# Patient Record
Sex: Male | Born: 1995 | Race: White | Hispanic: No | Marital: Single | Smoking: Former smoker
Health system: Southern US, Community
[De-identification: ages and names within clinical notes are randomized; demographics above are authoritative.]

## PROBLEM LIST (undated history)

## (undated) DIAGNOSIS — F909 Attention-deficit hyperactivity disorder, unspecified type: Secondary | ICD-10-CM

## (undated) DIAGNOSIS — J302 Other seasonal allergic rhinitis: Secondary | ICD-10-CM

## (undated) DIAGNOSIS — F32A Depression, unspecified: Secondary | ICD-10-CM

## (undated) DIAGNOSIS — D619 Aplastic anemia, unspecified: Secondary | ICD-10-CM

## (undated) DIAGNOSIS — F329 Major depressive disorder, single episode, unspecified: Secondary | ICD-10-CM

## (undated) DIAGNOSIS — N522 Drug-induced erectile dysfunction: Secondary | ICD-10-CM

## (undated) DIAGNOSIS — R519 Headache, unspecified: Secondary | ICD-10-CM

## (undated) DIAGNOSIS — I471 Supraventricular tachycardia: Secondary | ICD-10-CM

## (undated) HISTORY — DX: Aplastic anemia, unspecified: D61.9

## (undated) HISTORY — DX: Depression, unspecified: F32.A

## (undated) HISTORY — DX: Attention-deficit hyperactivity disorder, unspecified type: F90.9

## (undated) HISTORY — DX: Drug-induced erectile dysfunction: N52.2

## (undated) HISTORY — DX: Other seasonal allergic rhinitis: J30.2

## (undated) HISTORY — DX: Supraventricular tachycardia: I47.1

## (undated) HISTORY — DX: Headache, unspecified: R51.9

---
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
## 2015-01-19 ENCOUNTER — Telehealth: Payer: Self-pay

## 2015-01-19 ENCOUNTER — Ambulatory Visit (INDEPENDENT_AMBULATORY_CARE_PROVIDER_SITE_OTHER): Payer: BLUE CROSS/BLUE SHIELD | Admitting: Family Medicine

## 2015-01-19 VITALS — BP 124/76 | HR 84 | Temp 97.5°F | Resp 17 | Ht 70.5 in | Wt 113.0 lb

## 2015-01-19 DIAGNOSIS — Z720 Tobacco use: Secondary | ICD-10-CM

## 2015-01-19 DIAGNOSIS — J209 Acute bronchitis, unspecified: Secondary | ICD-10-CM

## 2015-01-19 MED ORDER — AZITHROMYCIN 250 MG PO TABS: ORAL_TABLET | ORAL | Status: DC

## 2015-01-19 MED ORDER — BENZONATATE 200 MG PO CAPS: 200.0000 mg | ORAL_CAPSULE | Freq: Three times a day (TID) | ORAL | Status: DC | PRN

## 2015-01-19 NOTE — Patient Instructions (Signed)
Nicotine Addiction Nicotine can act as both a stimulant (excites/activates) and a sedative (calms/quiets). Immediately after exposure to nicotine, there is a "kick" caused in part by the drug's stimulation of the adrenal glands and resulting discharge of adrenaline (epinephrine). The rush of adrenaline stimulates the body and causes a sudden release of sugar. This means that smokers are always slightly hyperglycemic. Hyperglycemic means that the blood sugar is high, just like in diabetics. Nicotine also decreases the amount of insulin which helps control sugar levels in the body. There is an increase in blood pressure, breathing, and the rate of heart beats.  In addition, nicotine indirectly causes a release of dopamine in the brain that controls pleasure and motivation. A similar reaction is seen with other drugs of abuse, such as cocaine and heroin. This dopamine release is thought to cause the pleasurable sensations when smoking. In some different cases, nicotine can also create a calming effect, depending on sensitivity of the smoker's nervous system and the dose of nicotine taken. WHAT HAPPENS WHEN NICOTINE IS TAKEN FOR LONG PERIODS OF TIME?  Long-term use of nicotine results in addiction. It is difficult to stop.  Repeated use of nicotine creates tolerance. Higher doses of nicotine are needed to get the "kick." When nicotine use is stopped, withdrawal may last a month or more. Withdrawal may begin within a few hours after the last cigarette. Symptoms peak within the first few days and may lessen within a few weeks. For some people, however, symptoms may last for months or longer. Withdrawal symptoms include:   Irritability.  Craving.  Learning and attention deficits.  Sleep disturbances.  Increased appetite. Craving for tobacco may last for 6 months or longer. Many behaviors done while using nicotine can also play a part in the severity of withdrawal symptoms. For some people, the feel,  smell, and sight of a cigarette and the ritual of obtaining, handling, lighting, and smoking the cigarette are closely linked with the pleasure of smoking. When stopped, they also miss the related behaviors which make the withdrawal or craving worse. While nicotine gum and patches may lessen the drug aspects of withdrawal, cravings often persist. WHAT ARE THE MEDICAL CONSEQUENCES OF NICOTINE USE?  Nicotine addiction accounts for one-third of all cancers. The top cancer caused by tobacco is lung cancer. Lung cancer is the number one cancer killer of both men and women.  Smoking is also associated with cancers of the:  Mouth.  Pharynx.  Larynx.  Esophagus.  Stomach.  Pancreas.  Cervix.  Kidney.  Ureter.  Bladder.  Smoking also causes lung diseases such as lasting (chronic) bronchitis and emphysema.  It worsens asthma in adults and children.  Smoking increases the risk of heart disease, including:  Stroke.  Heart attack.  Vascular disease.  Aneurysm.  Passive or secondary smoke can also increase medical risks including:  Asthma in children.  Sudden Infant Death Syndrome (SIDS).  Additionally, dropped cigarettes are the leading cause of residential fire fatalities.  Nicotine poisoning has been reported from accidental ingestion of tobacco products by children and pets. Death usually results in a few minutes from respiratory failure (when a person stops breathing) caused by paralysis. TREATMENT   Medication. Nicotine replacement medicines such as nicotine gum and the patch are used to stop smoking. These medicines gradually lower the dosage of nicotine in the body. These medicines do not contain the carbon monoxide and other toxins found in tobacco smoke.  Hypnotherapy.  Relaxation therapy.  Nicotine Anonymous (a 12-step support   program). Find times and locations in your local yellow pages. Document Released: 07/10/2004 Document Revised: 01/27/2012 Document  Reviewed: 12/31/2013 ExitCare Patient Information 2015 [employer]. This information is not intended to replace advice given to you by your health care provider. Make sure you discuss any questions you have with your health care provider. Acute Bronchitis Bronchitis is inflammation of the airways that extend from the windpipe into the lungs (bronchi). The inflammation often causes mucus to develop. This leads to a cough, which is the most common symptom of bronchitis.  In acute bronchitis, the condition usually develops suddenly and goes away over time, usually in a couple weeks. Smoking, allergies, and asthma can make bronchitis worse. Repeated episodes of bronchitis may cause further lung problems.  CAUSES Acute bronchitis is most often caused by the same virus that causes a cold. The virus can spread from person to person (contagious) through coughing, sneezing, and touching contaminated objects. SIGNS AND SYMPTOMS   Cough.   Fever.   Coughing up mucus.   Body aches.   Chest congestion.   Chills.   Shortness of breath.   Sore throat.  DIAGNOSIS  Acute bronchitis is usually diagnosed through a physical exam. Your health care provider will also ask you questions about your medical history. Tests, such as chest X-rays, are sometimes done to rule out other conditions.  TREATMENT  Acute bronchitis usually goes away in a couple weeks. Oftentimes, no medical treatment is necessary. Medicines are sometimes given for relief of fever or cough. Antibiotic medicines are usually not needed but may be prescribed in certain situations. In some cases, an inhaler may be recommended to help reduce shortness of breath and control the cough. A cool mist vaporizer may also be used to help thin bronchial secretions and make it easier to clear the chest.  HOME CARE INSTRUCTIONS  Get plenty of rest.   Drink enough fluids to keep your urine clear or pale yellow (unless you have a medical  condition that requires fluid restriction). Increasing fluids may help thin your respiratory secretions (sputum) and reduce chest congestion, and it will prevent dehydration.   Take medicines only as directed by your health care provider.  If you were prescribed an antibiotic medicine, finish it all even if you start to feel better.  Avoid smoking and secondhand smoke. Exposure to cigarette smoke or irritating chemicals will make bronchitis worse. If you are a smoker, consider using nicotine gum or skin patches to help control withdrawal symptoms. Quitting smoking will help your lungs heal faster.   Reduce the chances of another bout of acute bronchitis by washing your hands frequently, avoiding people with cold symptoms, and trying not to touch your hands to your mouth, nose, or eyes.   Keep all follow-up visits as directed by your health care provider.  SEEK MEDICAL CARE IF: Your symptoms do not improve after 1 week of treatment.  SEEK IMMEDIATE MEDICAL CARE IF:  You develop an increased fever or chills.   You have chest pain.   You have severe shortness of breath.  You have bloody sputum.   You develop dehydration.  You faint or repeatedly feel like you are going to pass out.  You develop repeated vomiting.  You develop a severe headache. MAKE SURE YOU:   Understand these instructions.  Will watch your condition.  Will get help right away if you are not doing well or get worse. Document Released: 12/12/2004 Document Revised: 03/21/2014 Document Reviewed: 04/27/2013 ExitCare Patient Information 2015   Information 2015 [employer], LLC. This information is not intended to replace advice given to you by your health care provider. Make sure you discuss any questions you have with your health care provider.  

## 2015-01-19 NOTE — Progress Notes (Addendum)
Subjective:    Patient ID: Bruce Curiadam F Selden, male    DOB: [birth year], 19 y.o.   MRN: [id number] This chart was scribed for Norberto SorensonEva Shaw, MD by Littie Deedsichard Sun, Medical Scribe. This patient was seen in Room 5 and the patient's care was started at 2:29 PM.   Chief Complaint  Patient presents with  . Nasal Congestion  . Cough  . Other    sneezing    HPI HPI Comments: Bruce Warner is a 19 y.o. male brought in by mother with a hx of aplastic anemia who presents to the [facility] complaining of gradual onset URI symptoms that started this morning. Patient has had an intermittent productive cough of mucus for the past few weeks, but he woke up this morning with HA, feeling warm, congestion and generalized myalgias. Mother notes that his cough worsened last night and this morning.  Patient has not tried any treatments for his symptoms. He notes that he has been sleeping and eating fine.   Patient denies hx of asthma, but his mother notes he did have reactive airway as a baby. He has a hx of aplastic anemia, for which he received a bone marrow transplant his brother. Patient recently started smoking. He has not had the flu shot this season.  Patient is leaving for a spring break trip to [place] soon with some friends. 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 He wants to go into Primary school teachergraphic design.   Past Medical History  Diagnosis Date  . Aplastic anemia 2005    s/p transplant 2005  . SVT (supraventricular tachycardia) 2009    s/p ablation 2009  . Seasonal allergies    Current Outpatient Prescriptions on File Prior to Visit  Medication Sig Dispense Refill  . ibuprofen (ADVIL,MOTRIN) 200 MG tablet Take 200 mg by mouth every 6 (six) hours as needed.     No current facility-administered medications on file prior to visit.   No Known Allergies   Review of Systems  Constitutional: Positive for chills, diaphoresis and  fatigue. Negative for fever, activity change, appetite change and unexpected weight change.  HENT: Positive for congestion, postnasal drip, rhinorrhea, sinus pressure, sneezing and sore throat. Negative for ear pain and mouth sores.   Respiratory: Positive for cough. Negative for shortness of breath and wheezing.   Cardiovascular: Negative for chest pain.  Gastrointestinal: Negative for nausea, vomiting, abdominal pain, diarrhea and constipation.  Genitourinary: Negative for dysuria.  Musculoskeletal: Positive for myalgias. Negative for joint swelling, arthralgias, gait problem, neck pain and neck stiffness.  Skin: Negative for rash.  Allergic/Immunologic: Positive for immunocompromised state.  Neurological: Positive for headaches. Negative for syncope.  Hematological: Negative for adenopathy.  Psychiatric/Behavioral: Negative for sleep disturbance.       Objective:  BP 124/76 mmHg  Pulse 84  Temp(Src) 97.5 F (36.4 C) (Oral)  Resp 17  Ht 5' 10.5" (1.791 m)  Wt 113 lb (51.256 kg)  BMI 15.98 kg/m2  SpO2 98%  Physical Exam  Constitutional: He is oriented to person, place, and time. He appears well-developed and well-nourished. No distress.  HENT:  Head: Normocephalic and atraumatic.  Right Ear: Tympanic membrane is retracted. No middle ear effusion.  Left Ear: Tympanic membrane is retracted.  No middle ear effusion.  Nose: Rhinorrhea present.  Mouth/Throat: Posterior oropharyngeal erythema present. No oropharyngeal exudate or posterior oropharyngeal edema.  No significant effusion. Clear nasal rhinorrhea.   Eyes:  Pupils are equal, round, and reactive to light.  Neck: Neck supple.  Cardiovascular: Normal rate, regular rhythm, S1 normal, S2 normal and normal heart sounds.   No murmur heard. Pulmonary/Chest: Effort normal and breath sounds normal. No respiratory distress. He has no wheezes. He has no rales.  Good air movement. CTAB.  Musculoskeletal: He exhibits no edema.    Neurological: He is alert and oriented to person, place, and time. No cranial nerve deficit.  Skin: Skin is warm and dry. No rash noted.  Psychiatric: He has a normal mood and affect. His behavior is normal.  Nursing note and vitals reviewed.      Assessment & Plan:   Acute bronchitis, unspecified organism  Tobacco abuse - I advised pt to stop smoking immed - concerned for affecting immune system - reviewed adverse effects w/ pt - mother present and asking me mult times to tell pt he has to quit and why - which unfortunately I suspect has made my recommendations to quit to pt possilby less  enticing and effective to inspiring actual pt change who I think is clearly using this as a small way to rebel but will cont to ensure this is addressed at all f/u OV.  Whole family sees Dr Merla Riches so will f/u w/ him or Chelle since prior PCP Dunn has left  Meds ordered this encounter  Medications  . azithromycin (ZITHROMAX) 250 MG tablet    Sig: Take 2 tabs PO x 1 dose, then 1 tab PO QD x 4 days    Dispense:  6 tablet    Refill:  0  . benzonatate (TESSALON) 200 MG capsule    Sig: Take 1 capsule (200 mg total) by mouth 3 (three) times daily as needed for cough.    Dispense:  40 capsule    Refill:  0    I personally performed the services described in this documentation, which was scribed in my presence. The recorded information has been reviewed and considered, and addended by me as needed.  Norberto Sorenson, MD MPH

## 2015-01-19 NOTE — Telephone Encounter (Signed)
Scheduling can we make appt for pt?

## 2015-01-19 NOTE — Telephone Encounter (Signed)
coreect - I see the  2 sibs and could add him by appt at 104 or see sooner at 102 if needed

## 2015-01-19 NOTE — Telephone Encounter (Signed)
Patient's mother would like to get permission from Dr. Yong Channelolittle for schedule an appointment even though he is not an established patient. Patient's mother states that her family has been seeing Dr. Merla Richesoolittle for several year. Please call Mervyn GayLora! [phone]

## 2015-01-19 NOTE — Telephone Encounter (Signed)
Dr. Merla Richesoolittle, what do you think? I would advise to come into the walk-in clinic to see you. Please advise.

## 2015-01-27 NOTE — Telephone Encounter (Signed)
Left message for patient to call back to schedule appointment with Dr Merla Richesoolittle.

## 2015-03-14 ENCOUNTER — Ambulatory Visit (INDEPENDENT_AMBULATORY_CARE_PROVIDER_SITE_OTHER): Payer: BLUE CROSS/BLUE SHIELD | Admitting: Internal Medicine

## 2015-03-14 VITALS — BP 100/62 | HR 70 | Temp 97.6°F | Resp 18 | Ht 70.5 in | Wt 121.0 lb

## 2015-03-14 DIAGNOSIS — F909 Attention-deficit hyperactivity disorder, unspecified type: Secondary | ICD-10-CM

## 2015-03-14 DIAGNOSIS — F988 Other specified behavioral and emotional disorders with onset usually occurring in childhood and adolescence: Secondary | ICD-10-CM

## 2015-03-14 DIAGNOSIS — I471 Supraventricular tachycardia: Secondary | ICD-10-CM | POA: Insufficient documentation

## 2015-03-14 MED ORDER — AMPHETAMINE-DEXTROAMPHETAMINE 10 MG PO TABS: 10.0000 mg | ORAL_TABLET | Freq: Two times a day (BID) | ORAL | Status: DC

## 2015-03-14 NOTE — Progress Notes (Signed)
Subjective:    Patient ID: Bruce Curiadam F Shasteen, male    DOB: 1996/06/29, 19 y.o.   MRN: [id number]  This chart was scribed for Tonye Pearsonobert P Bhavika Schnider, MD by Ronney LionSuzanne Le, ED Scribe. This patient was seen in room 12 and the patient's care was started at 6:59 PM.   HPI  Chief Complaint  Patient presents with  . ADHD    HPI Comments: Bruce Warner is a 19 y.o. male who presents to the [facility] for ADHD evaluation and treatment. He is brought in by his sister who is currently on medications for ADD. He has an older brother also on medications. Sister is concerned that he might not graduate as he has procrastinated all semester.  Patient is currently in 12th grade at [employer] and is finishing up the semester. He is interested in attending a college in [place] although he has other local options. He reports he did well during freshman and sophomore year but performed poorly during junior year. His mom is supportive of him going to college, and his uncle will be financially supportive. Father and mother are divorced father lives in [place].  Per sister, patient is lazy and plays video games too often. He has not had any problems with delinquency. He has not been treated for ADHD in the past, but his brother and sister are both taking medications for ADHD. His sister takes Dexedrine and his brother takes Adderall. He denies a personal or family history of any other chronic medical conditions, including asthma. Patient does have SVT, with accompanying surgery, when he was 8. He has been stable since  No history of behavioral problems or drug use  Past Medical History  Diagnosis Date  . Aplastic anemia 2005    s/p transplant 2005  . SVT (supraventricular tachycardia) 2009    s/p ablation 2009  . Seasonal allergies    medical problems are now stable Prior to Admission medications   Medication Sig Start Date End Date Taking? Authorizing Provider  ibuprofen  (ADVIL,MOTRIN) 200 MG tablet Take 200 mg by mouth every 6 (six) hours as needed.   Yes Historical Provider, MD      No Known Allergies   Review of Systems  Constitutional: Negative for activity change, appetite change, fatigue and unexpected weight change.  Eyes: Negative for photophobia and visual disturbance.  Respiratory: Negative for chest tightness and wheezing.   Cardiovascular: Negative for chest pain and palpitations.  Neurological: Negative for dizziness, tremors, speech difficulty, weakness, numbness and headaches.  Psychiatric/Behavioral: Negative for behavioral problems, sleep disturbance and dysphoric mood. The patient is not nervous/anxious.        Objective:   Physical Exam  Constitutional: He is oriented to person, place, and time. He appears well-developed and well-nourished. No distress.  HENT:  Head: Normocephalic.  Eyes: Conjunctivae and EOM are normal. Pupils are equal, round, and reactive to light.  EOMs are conjugate.  Neck: Normal range of motion. Neck supple. No thyromegaly present.  Cardiovascular: Normal rate, regular rhythm, normal heart sounds and intact distal pulses.   No murmur heard. Pulmonary/Chest: Effort normal and breath sounds normal. No respiratory distress.  Neurological: He is alert and oriented to person, place, and time. He has normal reflexes. No cranial nerve deficit.  Psychiatric: He has a normal mood and affect. His behavior is normal. Judgment and thought content normal.  Nursing note and vitals reviewed. BP 100/62 mmHg  Pulse 70  Temp(Src) 97.6 F (36.4  C) (Oral)  Resp 18  Ht 5' 10.5" (1.791 m)  Wt 121 lb (54.885 kg)  BMI 17.11 kg/m2  SpO2 96%     ADHD self-report scale very positive with lots of careless mistakes, difficulty keeping attention with repetitive work, trouble wrapping up final details of projects, difficulty with organization, procrastination, misplacing things, remembering appointments or obligations, and some  hyperactivity features as well Total score 49 Assessment & Plan:  ADHD-fits his family pattern well Meds ordered this encounter  Medications  . amphetamine-dextroamphetamine (ADDERALL) 10 MG tablet    Sig: Take 1 tablet (10 mg total) by mouth 2 (two) times daily.    Dispense:  60 tablet    Refill:  0   he will try 10 mg, 15 mg and 20 mg and call with report in 7-10 days for note prescription prior to exams We discussed side effects We discussed benefits Described the onset of action and parameters to watch for  I have completed the patient encounter in its entirety as documented by the scribe, with editing by me where necessary. Tamar Miano P. Merla Riches, M.D.

## 2015-04-08 IMAGING — CR DG CHEST 2V
2 series · 2 of 2 positions shown · non-contrast
Comparison: None.

CLINICAL DATA: Cough.

EXAM:
CHEST  2 VIEW

[PA]
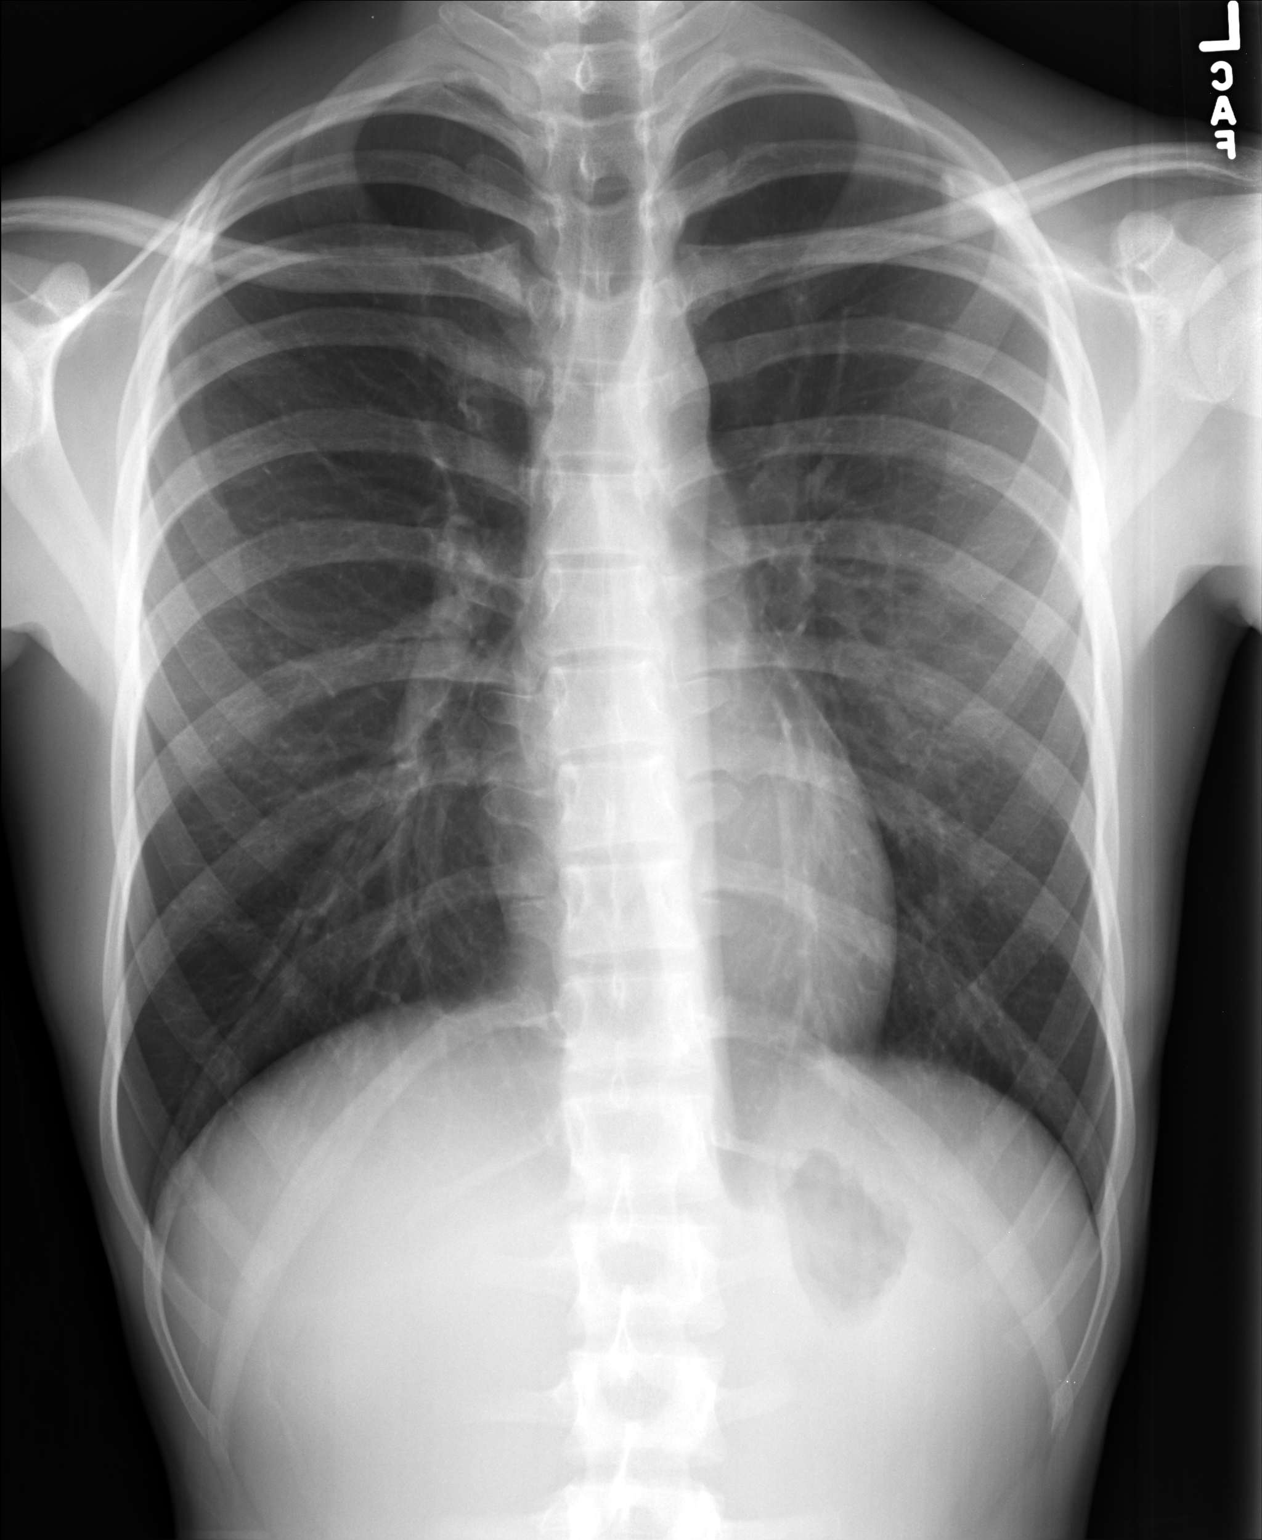

[lateral]
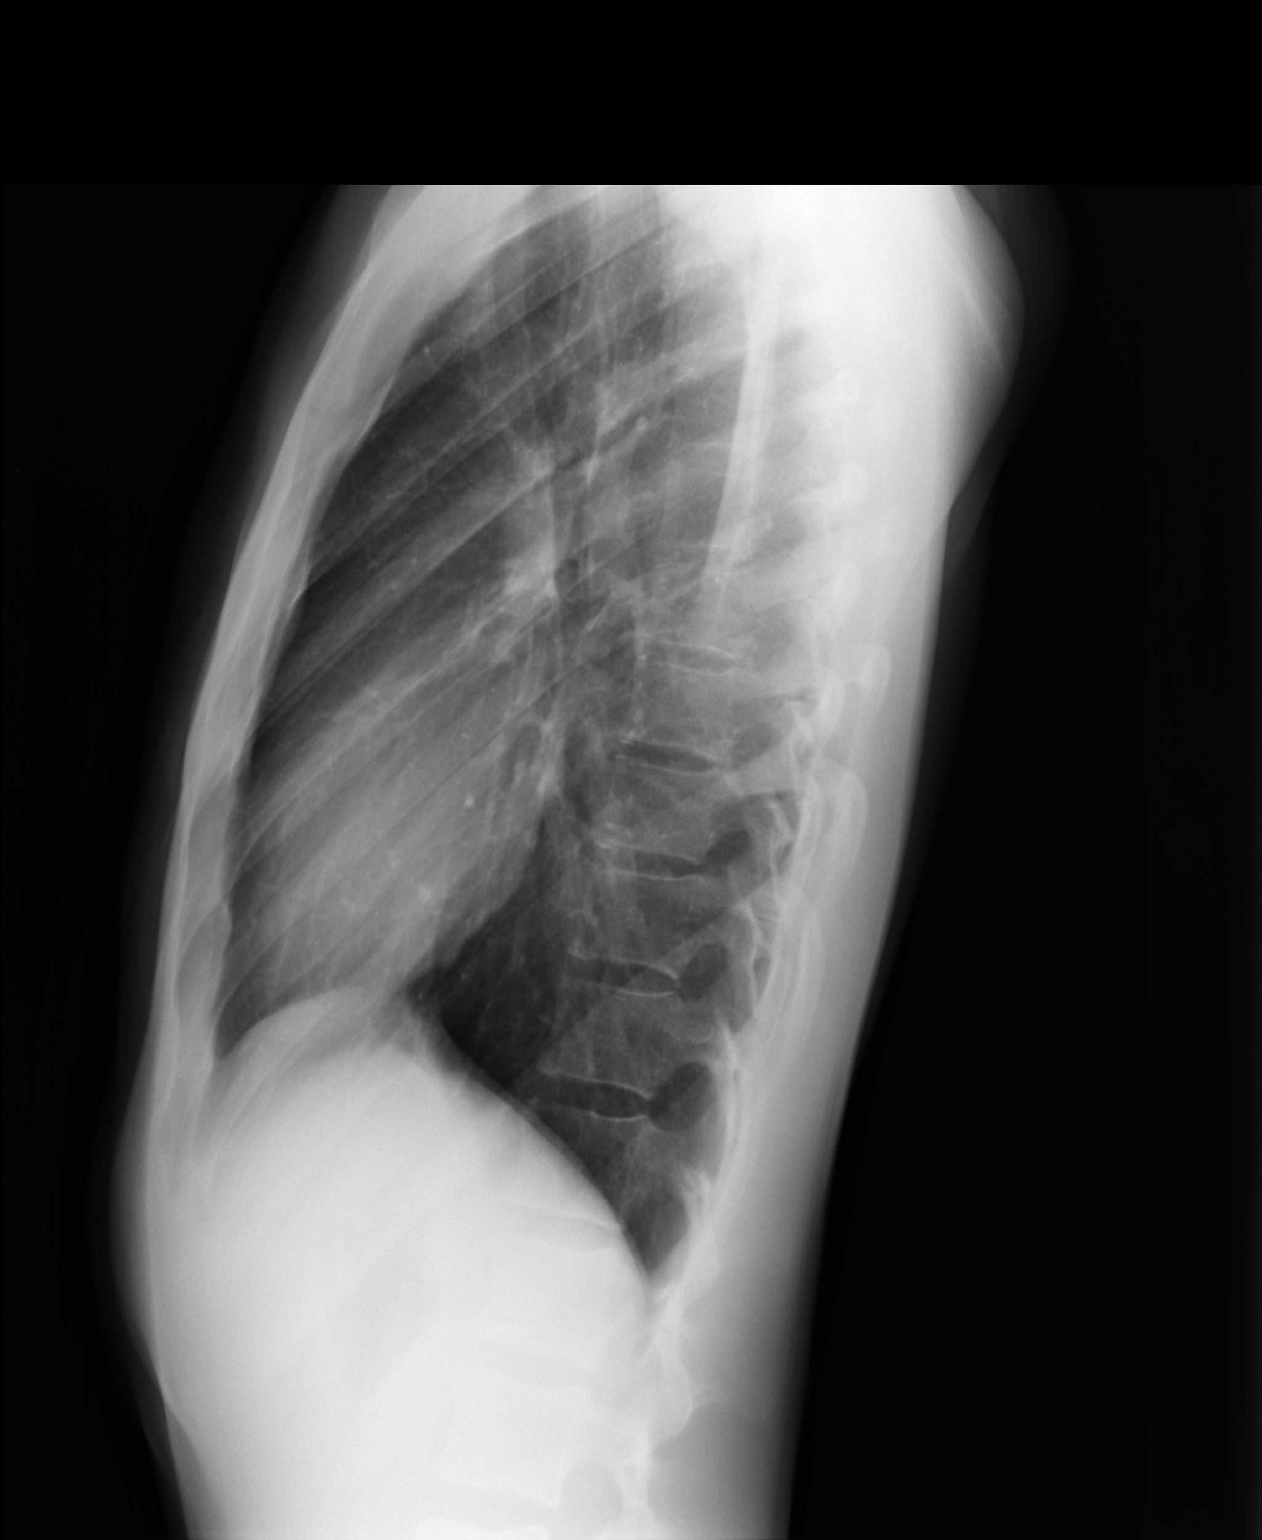

[2 of 2 positions shown; findings below may reference images not displayed]

FINDINGS: The heart size and mediastinal contours are within normal limits.
Both lungs are clear. The visualized skeletal structures are
unremarkable.
IMPRESSION: No active cardiopulmonary disease.

## 2015-04-10 ENCOUNTER — Telehealth: Payer: Self-pay

## 2015-04-10 NOTE — Telephone Encounter (Signed)
adderall refill   [phone]

## 2015-04-12 MED ORDER — AMPHETAMINE-DEXTROAMPHETAMINE 10 MG PO TABS: 10.0000 mg | ORAL_TABLET | Freq: Two times a day (BID) | ORAL | Status: DC

## 2015-04-12 NOTE — Telephone Encounter (Signed)
Ready

## 2015-04-12 NOTE — Telephone Encounter (Signed)
Rx in pick up draw. Pt notified on voicemail. 

## 2015-06-12 ENCOUNTER — Other Ambulatory Visit: Payer: Self-pay

## 2015-06-12 MED ORDER — AMPHETAMINE-DEXTROAMPHETAMINE 10 MG PO TABS: 10.0000 mg | ORAL_TABLET | Freq: Two times a day (BID) | ORAL | Status: DC

## 2015-06-12 NOTE — Telephone Encounter (Signed)
Pt requesting adderall refill   Best phone for pt is [phone]

## 2015-06-12 NOTE — Telephone Encounter (Signed)
With no message from him we will assume 10 mg was the right dose Meds ordered this encounter  Medications  . amphetamine-dextroamphetamine (ADDERALL) 10 MG tablet    Sig: Take 1 tablet (10 mg total) by mouth 2 (two) times daily.    Dispense:  60 tablet    Refill:  0   He should follow-up after this

## 2015-06-13 NOTE — Telephone Encounter (Signed)
Notified pt Rx ready and need for f/up w/in the month.

## 2015-06-26 ENCOUNTER — Ambulatory Visit (INDEPENDENT_AMBULATORY_CARE_PROVIDER_SITE_OTHER): Payer: BLUE CROSS/BLUE SHIELD | Admitting: Family Medicine

## 2015-06-26 VITALS — BP 118/80 | HR 79 | Temp 98.1°F | Resp 16 | Ht 70.0 in | Wt 118.0 lb

## 2015-06-26 DIAGNOSIS — Z111 Encounter for screening for respiratory tuberculosis: Secondary | ICD-10-CM | POA: Diagnosis not present

## 2015-06-26 DIAGNOSIS — R5383 Other fatigue: Secondary | ICD-10-CM

## 2015-06-26 DIAGNOSIS — Z862 Personal history of diseases of the blood and blood-forming organs and certain disorders involving the immune mechanism: Secondary | ICD-10-CM | POA: Diagnosis not present

## 2015-06-26 DIAGNOSIS — Z113 Encounter for screening for infections with a predominantly sexual mode of transmission: Secondary | ICD-10-CM

## 2015-06-26 DIAGNOSIS — Z0000 Encounter for general adult medical examination without abnormal findings: Secondary | ICD-10-CM | POA: Diagnosis not present

## 2015-06-26 LAB — POCT CBC
Granulocyte percent: 52.9 % (ref 37–80)
HCT, POC: 50.3 % (ref 43.5–53.7)
Hemoglobin: 16.2 g/dL (ref 14.1–18.1)
Lymph, poc: 2.2 (ref 0.6–3.4)
MCH, POC: 30.1 pg (ref 27–31.2)
MCHC: 32.1 g/dL (ref 31.8–35.4)
MCV: 93.7 fL (ref 80–97)
MID (cbc): 0.4 (ref 0–0.9)
MPV: 7.2 fL (ref 0–99.8)
POC Granulocyte: 2.9 (ref 2–6.9)
POC LYMPH PERCENT: 39.7 %L (ref 10–50)
POC MID %: 7.4 %M (ref 0–12)
Platelet Count, POC: 226 10*3/uL (ref 142–424)
RBC: 5.37 M/uL (ref 4.69–6.13)
RDW, POC: 13.1 %
WBC: 5.5 10*3/uL (ref 4.6–10.2)

## 2015-06-26 NOTE — Progress Notes (Signed)
Chief Complaint:  Chief Complaint  Patient presents with  . Annual Exam    For college    HPI: Bruce Warner is a 19 y.o. male who reports to [facility] today complaining of annual without blood work done. He goes for a 5 year checkup in acouple of years.  He is doing well no complaints Wants to know how he can He i snot hungru when he wakes He is on Adderrall, no SEs from Adderall.  He is UTD on vaccines except for meningitis and HPV, does not know if he wants to get thsi done today since optional for school He has a hx of aplastic anemia s/p Bone Marrow transplant and also SVT s/p ablation  CBC Latest Ref Rng 06/26/2015 06/18/2014 10/29/2013  WBC 4.6 - 10.2 K/uL 5.5 5.9 8.5  Hemoglobin 14.1 - 18.1 g/dL 40.9 81.1 91.4  Hematocrit 43.5 - 53.7 % 50.3 46.1 49.5  Platelets 150 - 400 K/uL - - -   He  has no petechaie, no bruising + fatigue, + gum bleeding He is sexually active has never been tested, he does do recreational drugs , marijuana and also has dabbled with xanax   Past Medical History  Diagnosis Date  . Aplastic anemia 2005    s/p transplant 2005  . SVT (supraventricular tachycardia) 2009    s/p ablation 2009  . Seasonal allergies    Past Surgical History  Procedure Laterality Date  . 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 Cardiac electrophysiology mapping and ablation  2009    for svt   History   Social History  . Marital Status: Single    Spouse Name: N/A  . Number of Children: N/A  . Years of Education: N/A   Occupational History  .      [employer] Highschool   Social History Main Topics  . Smoking status: Current Every Day Smoker    Types: Cigarettes  . Smokeless tobacco: Never Used     Comment: 1/2 pack per day  . Alcohol Use: 1.2 oz/week    2 Standard drinks or equivalent per week  . Drug Use: No     Comment: Marijuana - past use   . Sexual Activity: No   Other Topics Concern  . None   Social History Narrative   Lives  with Mom Vernona Rieger) and Stepfather Lorna Few)   Family History  Problem Relation Age of Onset  . Hypertension Maternal Grandfather   . Heart disease Paternal Grandfather   . Hyperlipidemia Maternal Grandmother   . Hypertension Maternal Grandmother   . Stroke Maternal Grandmother    No Known Allergies Prior to Admission medications   Medication Sig Start Date End Date Taking? Authorizing Provider  amphetamine-dextroamphetamine (ADDERALL) 10 MG tablet Take 1 tablet (10 mg total) by mouth 2 (two) times daily. 06/12/15  Yes Tonye Pearson, MD  azithromycin (ZITHROMAX) 250 MG tablet Take 2 tabs PO x 1 dose, then 1 tab PO QD x 4 days Patient not taking: Reported on 03/14/2015 01/19/15   Sherren Mocha, MD  benzonatate (TESSALON) 200 MG capsule Take 1 capsule (200 mg total) by mouth 3 (three) times daily as needed for cough. Patient not taking: Reported on 03/14/2015 01/19/15   Sherren Mocha, MD  ibuprofen (ADVIL,MOTRIN) 200 MG tablet Take 200 mg by mouth every 6 (six) hours as needed.    Historical Provider, MD     ROS: The patient  denies fevers, chills, night sweats, unintentional weight loss, chest pain, palpitations, wheezing, dyspnea on exertion, nausea, vomiting, abdominal pain, dysuria, hematuria, melena, numbness, weakness, or tingling. All other systems have been reviewed and were otherwise negative with the exception of those mentioned in the HPI and as above.    PHYSICAL EXAM: Filed Vitals:   06/26/15 1931  BP: 118/80  Pulse: 79  Temp: 98.1 F (36.7 C)  Resp: 16   Body mass index is 16.93 kg/(m^2).   General: Alert, no acute distress, thin white male HEENT:  Normocephalic, atraumatic, oropharynx patent. EOMI, PERRLA, fundoscopic exam bormal, tm normal Cardiovascular:  Regular rate and rhythm, no rubs murmurs or gallops.  No Carotid bruits, radial pulse intact. No pedal edema.  Respiratory: Clear to auscultation bilaterally.  No wheezes, rales, or rhonchi.  No cyanosis, no use of accessory  musculature Abdominal: No organomegaly, abdomen is soft and non-tender, positive bowel sounds. No masses. Skin: No rashes. Neurologic: Facial musculature symmetric. Psychiatric: Patient acts appropriately throughout our interaction. Lymphatic: No cervical or submandibular lymphadenopathy Musculoskeletal: Gait intact. No edema, tenderness 5/5 strength 2/2 DTRs neg scoliosis   LABS: Results for orders placed or performed in visit on 06/26/15  POCT CBC  Result Value Ref Range   WBC 5.5 4.6 - 10.2 K/uL   Lymph, poc 2.2 0.6 - 3.4   POC LYMPH PERCENT 39.7 10 - 50 %L   MID (cbc) 0.4 0 - 0.9   POC MID % 7.4 0 - 12 %M   POC Granulocyte 2.9 2 - 6.9   Granulocyte percent 52.9 37 - 80 %G   RBC 5.37 4.69 - 6.13 M/uL   Hemoglobin 16.2 14.1 - 18.1 g/dL   HCT, POC 16.1 09.6 - 53.7 %   MCV 93.7 80 - 97 fL   MCH, POC 30.1 27 - 31.2 pg   MCHC 32.1 31.8 - 35.4 g/dL   RDW, POC 04.5 %   Platelet Count, POC 226 142 - 424 K/uL   MPV 7.2 0 - 99.8 fL     EKG/XRAY:   Primary read interpreted by Dr[facility] Conley Rolls at [facility].   ASSESSMENT/PLAN: Encounter Diagnoses  Name Primary?  . Annual physical exam Yes  . Screening-pulmonary TB   . History of aplastic anemia   . Other fatigue   . Screening for STD (sexually transmitted disease)    Labs pending  Will fu in 2-3 days for TB results reading, at that time if he has talked it over with his mom and he wants to get HPV and or meningitis then we can give him the vaccines I have advised him to take only his prescribed meds and not to dabble in any recreational illicit drug activities since he has a heart history with SVT and already on a stimulant and how he is at the legal age for it to be on his permanent record.  I am not sure if I got through to him. Mom is supportive according to patient and is a good mom and has made him write about why it is not good to use drugs in the past.  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  Fu as  directed   Gross sideeffects, risk and benefits, and alternatives of medications d/w patient. Patient is aware that all medications have potential sideeffects and we are unable to predict every sideeffect or drug-drug interaction that may occur.  Ceri Mayer DO  06/26/2015 9:00 PM

## 2015-06-26 NOTE — Progress Notes (Signed)
  Tuberculosis Risk Questionnaire  1. No Were you born outside the [place] in one of the following parts of the world: [place], [place], [place], [place] or [place]?    2. No Have you traveled outside the [place] and lived for more than one month in one of the following parts of the world: [place], [place], [place], [place] or [place]?    3. Yes Do you have a compromised immune system such as from any of the following conditions:HIV/AIDS, organ or bone marrow transplantation, diabetes, immunosuppressive medicines (e.g. Prednisone, Remicaide), leukemia, lymphoma, cancer of the head or neck, gastrectomy or jejunal bypass, end-stage renal disease (on dialysis), or silicosis?     4. No Have you ever or do you plan on working in: a residential care center, a health care facility, a jail or prison or homeless shelter?    5. No Have you ever: injected illegal drugs, used crack cocaine, lived in a homeless shelter  or been in jail or prison?     6. No Have you ever been exposed to anyone with infectious tuberculosis?    Tuberculosis Symptom Questionnaire  Do you currently have any of the following symptoms?  1. No Unexplained cough lasting more than 3 weeks?   2. No Unexplained fever lasting more than 3 weeks.   3. No Night Sweats (sweating that leaves the bedclothes and sheets wet)     4. No Shortness of Breath   5. No Chest Pain   6. No Unintentional weight loss    7. Yes Unexplained fatigue (very tired for no reason)

## 2015-06-27 LAB — RPR

## 2015-06-27 LAB — COMPLETE METABOLIC PANEL WITHOUT GFR
ALT: 17 U/L (ref 8–46)
AST: 23 U/L (ref 12–32)
Albumin: 5 g/dL (ref 3.6–5.1)
Alkaline Phosphatase: 106 U/L (ref 48–230)
BUN: 15 mg/dL (ref 7–20)
CO2: 27 mmol/L (ref 20–31)
Calcium: 10.2 mg/dL (ref 8.9–10.4)
Chloride: 101 mmol/L (ref 98–110)
Creat: 0.95 mg/dL (ref 0.60–1.26)
GFR, Est African American: >89 mL/min
GFR, Est Non African American: >89 mL/min
Glucose, Bld: 84 mg/dL (ref 65–99)
Potassium: 4.2 mmol/L (ref 3.8–5.1)
Sodium: 140 mmol/L (ref 135–146)
Total Bilirubin: 0.6 mg/dL (ref 0.2–1.1)
Total Protein: 7.7 g/dL (ref 6.3–8.2)

## 2015-06-27 LAB — HIV ANTIBODY (ROUTINE TESTING W REFLEX): HIV 1&2 Ab, 4th Generation: NONREACTIVE

## 2015-06-27 LAB — TSH: TSH: 1.758 u[IU]/mL (ref 0.350–4.500)

## 2015-06-28 ENCOUNTER — Telehealth: Payer: Self-pay | Admitting: Family Medicine

## 2015-06-28 LAB — GC/CHLAMYDIA PROBE AMP
CT Probe RNA: NEGATIVE
GC Probe RNA: NEGATIVE

## 2015-06-28 NOTE — Telephone Encounter (Signed)
Spoke to pt about labs, recommend meningitis vaccine

## 2015-06-29 ENCOUNTER — Ambulatory Visit (INDEPENDENT_AMBULATORY_CARE_PROVIDER_SITE_OTHER): Payer: BLUE CROSS/BLUE SHIELD

## 2015-06-29 DIAGNOSIS — Z111 Encounter for screening for respiratory tuberculosis: Secondary | ICD-10-CM

## 2015-06-29 DIAGNOSIS — Z7689 Persons encountering health services in other specified circumstances: Secondary | ICD-10-CM

## 2015-06-29 LAB — TB SKIN TEST
Induration: 0 mm
TB Skin Test: NEGATIVE

## 2015-06-29 NOTE — Progress Notes (Signed)
   Subjective:    Patient ID: Bruce Warner, male    DOB: 1996/03/01, 19 y.o.   MRN: [id number]  HPI Patient was seen for PPD reading, results negative and induration 0.    Review of Systems     Objective:   Physical Exam        Assessment & Plan:

## 2015-08-25 ENCOUNTER — Ambulatory Visit (INDEPENDENT_AMBULATORY_CARE_PROVIDER_SITE_OTHER): Payer: BLUE CROSS/BLUE SHIELD | Admitting: Internal Medicine

## 2015-08-25 ENCOUNTER — Encounter: Payer: Self-pay | Admitting: Internal Medicine

## 2015-08-25 VITALS — BP 120/80 | HR 86 | Temp 98.5°F | Resp 16 | Ht 70.5 in | Wt 124.0 lb

## 2015-08-25 DIAGNOSIS — F988 Other specified behavioral and emotional disorders with onset usually occurring in childhood and adolescence: Secondary | ICD-10-CM

## 2015-08-25 DIAGNOSIS — F909 Attention-deficit hyperactivity disorder, unspecified type: Secondary | ICD-10-CM | POA: Diagnosis not present

## 2015-08-25 MED ORDER — AMPHETAMINE-DEXTROAMPHETAMINE 15 MG PO TABS: 15.0000 mg | ORAL_TABLET | Freq: Two times a day (BID) | ORAL | Status: DC

## 2015-08-25 NOTE — Progress Notes (Signed)
   Subjective:    Patient ID: Bruce Warner, male    DOB: 04/12/1996, 19 y.o.   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 Adderall is not lasting long enough at 10 mg No side effects  Nursing staff alarmed at positive PHQ9 but he says the only positive questions were really about eating and sleeping and denies any episodes of depression this year or any problems with anxiety. He is really getting along well at this point.    Review of Systems Noncontributory    Objective:   Physical Exam BP 120/80 mmHg  Pulse 86  Temp(Src) 98.5 F (36.9 C) (Oral)  Resp 16  Ht 5' 10.5" (1.791 m)  Wt 124 lb (56.246 kg)  BMI 17.53 kg/m2 Wt Readings from Last 3 Encounters:  08/25/15 124 lb (56.246 kg) (8 %*, Z = -1.39)  06/26/15 118 lb (53.524 kg) (4 %*, Z = -1.75)  03/14/15 121 lb (54.885 kg) (7 %*, Z = -1.49)   * Growth percentiles are based on CDC 2-20 Years data.   HEENT clear Heart regular Cranial nerves II through XII intact Mood good affect appropriate       Assessment & Plan:  ADD (attention deficit disorder)  Meds ordered this encounter  Medications  . amphetamine-dextroamphetamine (ADDERALL) 15 MG tablet    Sig: Take 1 tablet by mouth 2 (two) times daily.    Dispense:  60 tablet    Refill:  0  . amphetamine-dextroamphetamine (ADDERALL) 15 MG tablet    Sig: Take 1 tablet by mouth 2 (two) times daily. For 30d after signed    Dispense:  60 tablet    Refill:  0  . amphetamine-dextroamphetamine (ADDERALL) 15 MG tablet    Sig: Take 1 tablet by mouth 2 (two) times daily. For 60d after signed    Dispense:  60 tablet    Refill:  0   Call in 3 months and follow-up in 6

## 2015-11-24 ENCOUNTER — Ambulatory Visit (INDEPENDENT_AMBULATORY_CARE_PROVIDER_SITE_OTHER): Payer: BLUE CROSS/BLUE SHIELD | Admitting: Internal Medicine

## 2015-11-24 VITALS — BP 110/76 | HR 52 | Temp 97.6°F | Resp 18 | Ht 70.25 in | Wt 125.2 lb

## 2015-11-24 DIAGNOSIS — F909 Attention-deficit hyperactivity disorder, unspecified type: Secondary | ICD-10-CM

## 2015-11-24 DIAGNOSIS — F988 Other specified behavioral and emotional disorders with onset usually occurring in childhood and adolescence: Secondary | ICD-10-CM

## 2015-11-24 MED ORDER — AMPHETAMINE-DEXTROAMPHETAMINE 15 MG PO TABS
15.0000 mg | ORAL_TABLET | Freq: Two times a day (BID) | ORAL | Status: DC
Start: 2015-11-24 — End: 2016-03-10

## 2015-11-24 MED ORDER — AMPHETAMINE-DEXTROAMPHETAMINE 15 MG PO TABS: 15.0000 mg | ORAL_TABLET | Freq: Two times a day (BID) | ORAL | Status: DC

## 2015-11-24 NOTE — Progress Notes (Signed)
Subjective:  By signing my name below, I, Bruce Warner, attest that this documentation has been prepared under the direction and in the presence of Bruce Siaobert Makena Murdock, MD.  Watt Climesawaa Al Warner, Medical Scribe. 11/24/2015.  12:34 PM.   Patient ID: Bruce Warner, male    DOB: 12-19-1995, 20 y.o.   MRN: [id number]  Chief Complaint  Patient presents with  . Medication Refill    Adderall 15mg     HPI HPI Comments: Bruce Warner is a 20 y.o. male who presents to [facility] requesting a medication refill for adderall.  Pt indicates that first semester in college [employer] went well. Pt reports that has not experienced any side effects with the medication. He denies sleep disturbance, palpitations, weight change, anxiety, or mood changes.    Patient Active Problem List   Diagnosis Date Noted  . SVT (supraventricular tachycardia) (HCC) 03/14/2015  . History of aplastic anemia 03/08/2013   Past Medical History  Diagnosis Date  . Aplastic anemia (HCC) 2005    s/p transplant 2005  . SVT (supraventricular tachycardia) (HCC) 2009    s/p ablation 2009  . Seasonal allergies    Past Surgical History  Procedure Laterality Date  . 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 Cardiac electrophysiology mapping and ablation  2009    for svt   No Known Allergies Prior to Admission medications   Medication Sig Start Date End Date Taking? Authorizing Provider  amphetamine-dextroamphetamine (ADDERALL) 15 MG tablet Take 1 tablet by mouth 2 (two) times daily. 08/25/15  Yes Tonye Pearsonobert P Jaia Alonge, MD  amphetamine-dextroamphetamine (ADDERALL) 15 MG tablet Take 1 tablet by mouth 2 (two) times daily. For 30d after signed 08/25/15  Yes Tonye Pearsonobert P Dariel Pellecchia, MD  amphetamine-dextroamphetamine (ADDERALL) 15 MG tablet Take 1 tablet by mouth 2 (two) times daily. For 60d after signed 08/25/15  Yes Tonye Pearsonobert P Sephira Zellman, MD  ibuprofen (ADVIL,MOTRIN) 200 MG tablet Take 200 mg by mouth every  6 (six) hours as needed.   Yes Historical Provider, MD   Social History   Social History  . Marital Status: Single    Spouse Name: N/A  . Number of Children: N/A  . Years of Education: N/A   Occupational History  .      [employer]   Social History Main Topics  . Smoking status: Current Every Day Smoker    Types: Cigarettes  . Smokeless tobacco: Never Used     Comment: 1/2 pack per day  . Alcohol Use: 1.2 oz/week    2 Standard drinks or equivalent per week  . Drug Use: No     Comment: Marijuana - past use   . Sexual Activity: No   Other Topics Concern  . Not on file   Social History Narrative   Lives with Mom Bruce Warner(Bruce) and Stepfather Bruce Warner(Bruce Warner)    Review of Systems  Constitutional: Negative for unexpected weight change.  Cardiovascular: Negative for palpitations.  Psychiatric/Behavioral: Negative for sleep disturbance, dysphoric mood and decreased concentration. The patient is not nervous/anxious.       Objective:   Physical Exam  Constitutional: He is oriented to person, place, and time. He appears well-developed and well-nourished. No distress.  HENT:  Head: Normocephalic and atraumatic.  Eyes: EOM are normal. Pupils are equal, round, and reactive to light.  Neck: Neck supple.  Cardiovascular: Normal rate.   Pulmonary/Chest: Effort normal.  Neurological: He is alert and oriented to person, place, and  time. No cranial nerve deficit.  Skin: Skin is warm and dry.  Psychiatric: He has a normal mood and affect. His behavior is normal.  Nursing note and vitals reviewed.   BP 110/76 mmHg  Pulse 52  Temp(Src) 97.6 F (36.4 C) (Oral)  Resp 18  Ht 5' 10.25" (1.784 m)  Wt 125 lb 3.2 oz (56.79 kg)  BMI 17.84 kg/m2  SpO2 98%     Assessment & Plan:  ADD (attention deficit disorder) Meds ordered this encounter  Medications  . amphetamine-dextroamphetamine (ADDERALL) 15 MG tablet    Sig: Take 1 tablet by mouth 2 (two) times daily. For 60d after signed    Dispense:   60 tablet    Refill:  0  . amphetamine-dextroamphetamine (ADDERALL) 15 MG tablet    Sig: Take 1 tablet by mouth 2 (two) times daily. For 30d after signed    Dispense:  60 tablet    Refill:  0  . amphetamine-dextroamphetamine (ADDERALL) 15 MG tablet    Sig: Take 1 tablet by mouth 2 (two) times daily.    Dispense:  60 tablet    Refill:  0   Call 3/fu 32m   I have completed the patient encounter in its entirety as documented by the scribe, with editing by me where necessary. Thandiwe Siragusa P. Merla Riches, M.D.

## 2016-03-10 ENCOUNTER — Ambulatory Visit (INDEPENDENT_AMBULATORY_CARE_PROVIDER_SITE_OTHER): Payer: BLUE CROSS/BLUE SHIELD | Admitting: Internal Medicine

## 2016-03-10 VITALS — BP 118/81 | HR 111 | Temp 97.7°F | Resp 16 | Ht 70.0 in | Wt 126.0 lb

## 2016-03-10 DIAGNOSIS — F4325 Adjustment disorder with mixed disturbance of emotions and conduct: Secondary | ICD-10-CM | POA: Diagnosis not present

## 2016-03-10 DIAGNOSIS — F988 Other specified behavioral and emotional disorders with onset usually occurring in childhood and adolescence: Secondary | ICD-10-CM

## 2016-03-10 DIAGNOSIS — F909 Attention-deficit hyperactivity disorder, unspecified type: Secondary | ICD-10-CM

## 2016-03-10 MED ORDER — AMPHETAMINE-DEXTROAMPHETAMINE 15 MG PO TABS: 15.0000 mg | ORAL_TABLET | Freq: Two times a day (BID) | ORAL | Status: DC

## 2016-03-10 NOTE — Patient Instructions (Signed)
     IF you received an x-ray today, you will receive an invoice from [facility]. Please contact [facility] at [phone] with questions or concerns regarding your invoice.   IF you received labwork today, you will receive an invoice from [facility]. Please contact [facility] at [phone] with questions or concerns regarding your invoice.   Our billing staff will not be able to assist you with questions regarding bills from these companies.  You will be contacted with the lab results as soon as they are available. The fastest way to get your results is to activate your My Chart account. Instructions are located on the last page of this paperwork. If you have not heard from us regarding the results in 2 weeks, please contact this office.      

## 2016-03-10 NOTE — Progress Notes (Signed)
Subjective:  By signing my name below, I, Bruce Warner, attest that this documentation has been prepared under the direction and in the presence of Bruce Siaobert Jaykub Mackins, MD. Electronically Signed: Stann Oresung-Kai Warner, Scribe. 03/10/2016 , 4:34 PM .  Patient was seen in Room 12 .   Patient ID: Bruce Warner, male    DOB: March 03, 1996, 20 y.o.   MRN: [id number] Chief Complaint  Patient presents with  . Medication Refill    adderall  . positive PHQ-9   HPI Bruce Warner is a [age] y.o. male who [facility] medication refill of adderall. He is generally doing well. He does note his sleep schedule is off. He plans to work over the summer.  Noting that his pH Q9 was positive:  [facility] 2/9 03/10/2016 11/24/2015 08/25/2015 06/26/2015  Decreased Interest 1 0 1 0  Down, Depressed, Hopeless 1 0 1 0  PHQ - 2 Score 2 0 2 0  Altered sleeping 3 - 3 -  Tired, decreased energy 3 - 3 -  Change in appetite 3 - 2 -  Feeling bad or failure about yourself  1 - 1 -  Trouble concentrating 3 - 1 -  Moving slowly or fidgety/restless 0 - 0 -  Suicidal thoughts 0 - 0 -  PHQ-9 Score 15 - 12 -  Difficult doing work/chores Somewhat difficult - - -   I discussed the underlying problems. 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 He thinks he will get better when school is over.  It is important to note that he has had a long and difficult time as a child. His parents had an explosive relationship. His father is a drug abuser who deserted the family recently and lives [place] new partner [place]. The parents had been split for over a decade. Bruce Warner has lived mainly with his sister acting his mother as he and his  mother cannot get along and living together. This has turned out to be a fairly good arrangement and his mother remains active in his life in a good way.  His older brother is often in serious trouble. His brother father and sister all have very serious attention deficit problems. His sister is currently in therapy for her depression. She also has obsessive-compulsive disorder.  He feels like he has had a good response to Adderall is to continue this dose. He denies illicit drugs or other problems with his health.   Patient Active Problem List   Diagnosis Date Noted  . SVT (supraventricular tachycardia) (HCC) 03/14/2015  . History of aplastic anemia 03/08/2013    Current outpatient prescriptions:  .  amphetamine-dextroamphetamine (ADDERALL) 15 MG tablet, Take 1 tablet by mouth 2 (two) times daily., Disp: 60 tablet, Rfl: 0 .  amphetamine-dextroamphetamine (ADDERALL) 15 MG tablet, Take 1 tablet by mouth 2 (two) times daily. For 30d after signed, Disp: 60 tablet, Rfl: 0 .  amphetamine-dextroamphetamine (ADDERALL) 15 MG tablet, Take 1 tablet by mouth 2 (two) times daily. For 60d after signed, Disp: 60 tablet, Rfl: 0 .  ibuprofen (ADVIL,MOTRIN) 200 MG tablet, Take 200 mg by mouth every 6 (six) hours as needed.,  Disp: , Rfl:  No Known Allergies  Review of Systems  Constitutional: Negative for fever, chills and fatigue.  Cardiovascular: Negative for chest pain and palpitations.  Gastrointestinal: Negative for nausea, vomiting, diarrhea and constipation.  Neurological: Negative for dizziness, weakness, numbness and headaches.  Psychiatric/Behavioral: Positive for decreased concentration.      Objective:   Physical Exam  Constitutional: He is oriented to person, place, and time. He appears well-developed and well-nourished. No distress.  HENT:  Head: Normocephalic and atraumatic.  Eyes: EOM are normal. Pupils are equal, round, and reactive to light.  Neck: Neck supple.  Cardiovascular:  Normal rate.   Pulmonary/Chest: Effort normal. No respiratory distress.  Musculoskeletal: Normal range of motion.  Neurological: He is alert and oriented to person, place, and time.  Skin: Skin is warm and dry.  Psychiatric: He has a normal mood and affect. His behavior is normal.  Nursing note and vitals reviewed.  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 amphetamine-dextroamphetamine (ADDERALL) 15 MG tablet    Sig: Take 1 tablet by mouth 2 (two) times daily.    Dispense:  60 tablet    Refill:  0  . amphetamine-dextroamphetamine (ADDERALL) 15 MG tablet    Sig: Take 1 tablet by mouth 2 (two) times daily. For 30d after signed    Dispense:  60 tablet    Refill:  0  . amphetamine-dextroamphetamine (ADDERALL) 15 MG tablet    Sig: Take 1 tablet by mouth 2 (two) times daily. For 60d after signed    Dispense:  60 tablet    Refill:  0   He will follow-up here in 3 months Bruce Weber PA-C(OrChel Leotis Shames PA-C whom he has seen in the past) If his depression does not resolve the summer he will return for referral to counseling

## 2016-03-11 DIAGNOSIS — F988 Other specified behavioral and emotional disorders with onset usually occurring in childhood and adolescence: Secondary | ICD-10-CM | POA: Insufficient documentation

## 2016-03-11 DIAGNOSIS — F4325 Adjustment disorder with mixed disturbance of emotions and conduct: Secondary | ICD-10-CM | POA: Insufficient documentation

## 2016-03-11 DIAGNOSIS — F902 Attention-deficit hyperactivity disorder, combined type: Secondary | ICD-10-CM | POA: Insufficient documentation

## 2016-08-02 ENCOUNTER — Ambulatory Visit: Payer: BLUE CROSS/BLUE SHIELD

## 2016-08-08 ENCOUNTER — Ambulatory Visit: Payer: BLUE CROSS/BLUE SHIELD

## 2016-08-15 ENCOUNTER — Ambulatory Visit: Payer: BLUE CROSS/BLUE SHIELD

## 2016-08-16 ENCOUNTER — Ambulatory Visit (INDEPENDENT_AMBULATORY_CARE_PROVIDER_SITE_OTHER): Payer: BLUE CROSS/BLUE SHIELD | Admitting: Family Medicine

## 2016-08-16 VITALS — BP 110/72 | HR 84 | Temp 97.7°F | Resp 18 | Ht 70.04 in | Wt 129.0 lb

## 2016-08-16 DIAGNOSIS — F909 Attention-deficit hyperactivity disorder, unspecified type: Secondary | ICD-10-CM

## 2016-08-16 MED ORDER — LISDEXAMFETAMINE DIMESYLATE 30 MG PO CAPS: 30.0000 mg | ORAL_CAPSULE | Freq: Every day | ORAL | 0 refills | Status: DC

## 2016-08-16 NOTE — Patient Instructions (Addendum)
Follow-up in 1 month for medication re-evaluation.  Bruce Warner. Bruce Pea, MSN, FNP-C [facility]    IF you received an x-ray today, you will receive an invoice from [facility]. Please contact [facility] at [phone] with questions or concerns regarding your invoice.   IF you received labwork today, you will receive an invoice from [facility]. Please contact [facility] at [phone] with questions or concerns regarding your invoice.   Our billing staff will not be able to assist you with questions regarding bills from these companies.  You will be contacted with the lab results as soon as they are available. The fastest way to get your results is to activate your My Chart account. Instructions are located on the last page of this paperwork. If you have not heard from [place] regarding the results in 2 weeks, please contact this office.    adhdAttention Deficit Hyperactivity Disorder Attention deficit hyperactivity disorder (ADHD) is a problem with behavior issues based on the way the brain functions (neurobehavioral disorder). It is a common reason for behavior and academic problems in school. SYMPTOMS  There are 3 types of ADHD. The 3 types and some of the symptoms include:  Inattentive.  Gets bored or distracted easily.  Loses or forgets things. Forgets to hand in homework.  Has trouble organizing or completing tasks.  Difficulty staying on task.  An inability to organize daily tasks and school work.  Leaving projects, chores, or homework unfinished.  Trouble paying attention or responding to details. Careless mistakes.  Difficulty following directions. Often seems like is not listening.  Dislikes activities that require sustained attention (like chores or homework).  Hyperactive-impulsive.  Feels like it is impossible to sit still or stay in a seat. Fidgeting with hands and  feet.  Trouble waiting turn.  Talking too much or out of turn. Interruptive.  Speaks or acts impulsively.  Aggressive, disruptive behavior.  Constantly busy or on the go; noisy.  Often leaves seat when they are expected to remain seated.  Often runs or climbs where it is not appropriate, or feels very restless.  Combined.  Has symptoms of both of the above. Often children with ADHD feel discouraged about themselves and with school. They often perform well below their abilities in school. As children get older, the excess motor activities can calm down, but the problems with paying attention and staying organized persist. Most children do not outgrow ADHD but with good treatment can learn to cope with the symptoms. DIAGNOSIS  When ADHD is suspected, the diagnosis should be made by professionals trained in ADHD. This professional will collect information about the individual suspected of having ADHD. Information must be collected from various settings where the person lives, works, or attends school.  Diagnosis will include:  Confirming symptoms began in childhood.  Ruling out other reasons for the child's behavior.  The health care providers will check with the child's school and check their medical records.  They will talk to teachers and parents.  Behavior rating scales for the child will be filled out by those dealing with the child on a daily basis. A diagnosis is made only after all information has been considered. TREATMENT  Treatment usually includes behavioral treatment, tutoring or extra support in school, and stimulant medicines. Because of the way a person's brain works with ADHD, these medicines decrease impulsivity and hyperactivity and increase attention. This is different than how they would work in a person who  does not have ADHD. Other medicines used include antidepressants and certain blood pressure medicines. Most experts agree that treatment for ADHD should  address all aspects of the person's functioning. Along with medicines, treatment should include structured classroom management at school. Parents should reward good behavior, provide constant discipline, and set limits. Tutoring should be available for the child as needed. ADHD is a lifelong condition. If untreated, the disorder can have long-term serious effects into adolescence and adulthood. HOME CARE INSTRUCTIONS   Often with ADHD there is a lot of frustration among family members dealing with the condition. Blame and anger are also feelings that are common. In many cases, because the problem affects the family as a whole, the entire family may need help. A therapist can help the family find better ways to handle the disruptive behaviors of the person with ADHD and promote change. If the person with ADHD is young, most of the therapist's work is with the parents. Parents will learn techniques for coping with and improving their child's behavior. Sometimes only the child with the ADHD needs counseling. Your health care providers can help you make these decisions.  Children with ADHD may need help learning how to organize. Some helpful tips include:  Keep routines the same every day from wake-up time to bedtime. Schedule all activities, including homework and playtime. Keep the schedule in a place where the person with ADHD will often see it. Mark schedule changes as far in advance as possible.  Schedule outdoor and indoor recreation.  Have a place for everything and keep everything in its place. This includes clothing, backpacks, and school supplies.  Encourage writing down assignments and bringing home needed books. Work with your child's teachers for assistance in organizing school work.  Offer your child a well-balanced diet. Breakfast that includes a balance of whole grains, protein, and fruits or vegetables is especially important for school performance. Children should avoid drinks with  caffeine including:  Soft drinks.  Coffee.  Tea.  However, some older children (adolescents) may find these drinks helpful in improving their attention. Because it can also be common for adolescents with ADHD to become addicted to caffeine, talk with your health care provider about what is a safe amount of caffeine intake for your child.  Children with ADHD need consistent rules that they can understand and follow. If rules are followed, give small rewards. Children with ADHD often receive, and expect, criticism. Look for good behavior and praise it. Set realistic goals. Give clear instructions. Look for activities that can foster success and self-esteem. Make time for pleasant activities with your child. Give lots of affection.  Parents are their children's greatest advocates. Learn as much as possible about ADHD. This helps you become a stronger and better advocate for your child. It also helps you educate your child's teachers and instructors if they feel inadequate in these areas. Parent support groups are often helpful. A national group with local chapters is called Children and Adults with Attention Deficit Hyperactivity Disorder (CHADD). SEEK MEDICAL CARE IF:  Your child has repeated muscle twitches, cough, or speech outbursts.  Your child has sleep problems.  Your child has a marked loss of appetite.  Your child develops depression.  Your child has new or worsening behavioral problems.  Your child develops dizziness.  Your child has a racing heart.  Your child has stomach pains.  Your child develops headaches. SEEK IMMEDIATE MEDICAL CARE IF:  Your child has been diagnosed with depression or anxiety and  the symptoms seem to be getting worse.  Your child has been depressed and suddenly appears to have increased energy or motivation.  You are worried that your child is having a bad reaction to a medication he or she is taking for ADHD.   This information is not intended  to replace advice given to you by your health care provider. Make sure you discuss any questions you have with your health care provider.   Document Released: 10/25/2002 Document Revised: 11/09/2013 Document Reviewed: 07/12/2013 Elsevier Interactive Patient Education [employer]2016 Elsevier Inc.

## 2016-08-16 NOTE — Progress Notes (Signed)
Patient ID: Bruce Warner, male    DOB: May 17, 1996, 20 y.o.   MRN: [id number]  PCP: Eula Listenyan Dunn, PA-C  Chief Complaint  Patient presents with  . Follow-up    adderall    Subjective:   HPI 20 year old , male presents for evaluation of ADHD medication refill. He requests to change his medication from Adderall to Vyvanse.  He reports he would experience periodic episodes of depression while taking Adderall that resolved after 3 days after his medication ran out.   He reports he still needs medication to help with attention and focus. 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   Social History   Social History  . Marital status: Single    Spouse name: N/A  . Number of children: N/A  . Years of education: N/A   Occupational History  .      [employer]   Social History Main Topics  . Smoking status: Current Every Day Smoker    Types: Cigarettes  . Smokeless tobacco: Never Used     Comment: 1/2 pack per day  . Alcohol use 1.2 oz/week    2 Standard drinks or equivalent per week  . Drug use: No     Comment: Marijuana - past use   . Sexual activity: No   Other Topics Concern  . Not on file   Social History Narrative   Lives with Mom [name] and Stepfather [name]   Family History  Problem Relation Age of Onset  . Hypertension Maternal Grandfather   . Heart disease Paternal Grandfather   . Hyperlipidemia Maternal Grandmother   . Hypertension Maternal Grandmother   . Stroke Maternal Grandmother    Review of Systems See HPI  Patient Active Problem List   Diagnosis Date Noted  . Attention deficit disorder 03/11/2016  . 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 SVT (supraventricular tachycardia) (HCC) 03/14/2015  . History of aplastic anemia 03/08/2013     Prior to Admission medications   Medication Sig Start Date End Date Taking? Authorizing Provider   amphetamine-dextroamphetamine (ADDERALL) 15 MG tablet Take 1 tablet by mouth 2 (two) times daily. 03/10/16  Yes Tonye Pearsonobert P Doolittle, MD  amphetamine-dextroamphetamine (ADDERALL) 15 MG tablet Take 1 tablet by mouth 2 (two) times daily. For 30d after signed 03/10/16  Yes Tonye Pearsonobert P Doolittle, MD  amphetamine-dextroamphetamine (ADDERALL) 15 MG tablet Take 1 tablet by mouth 2 (two) times daily. For 60d after signed 03/10/16  Yes Tonye Pearsonobert P Doolittle, MD  diphenhydramine-acetaminophen (TYLENOL PM) 25-500 MG TABS tablet Take 1 tablet by mouth at bedtime as needed.   Yes Historical Provider, MD  ibuprofen (ADVIL,MOTRIN) 200 MG tablet Take 200 mg by mouth every 6 (six) hours as needed.   Yes Historical Provider, MD   No Known Allergies     Objective:  Physical Exam  Constitutional: He is oriented to person, place, and time. He appears well-developed and well-nourished.  HENT:  Head: Normocephalic and atraumatic.  Right Ear: External ear normal.  Left Ear: External ear normal.  Nose: Nose normal.  Eyes: Conjunctivae and EOM are normal. Pupils are equal, round, and reactive to light.  Neck: Normal range of motion.  Cardiovascular: Normal rate, regular rhythm and normal heart sounds.   Pulmonary/Chest: Effort normal and breath sounds normal.  Musculoskeletal: Normal range of motion.  Neurological: He is alert and oriented to person, place, and time.  Skin: Skin is warm and dry.  Psychiatric: He has a normal mood and affect. His behavior is normal. Judgment and thought content normal.   Vitals:   08/16/16 1548  BP: 110/72  Pulse: 84  Resp: 18  Temp: 97.7 F (36.5 C)    Assessment & Plan:  1. Attention deficit hyperactivity disorder (ADHD), unspecified ADHD type Patient scored greater than 24 on the Adult ADHD scale which indicates high likelihood of ADHD.  Plan: Start lisdexamfetamine (Vyvanse) 30 mg daily.   Follow-up in 1 month for medication re-evaluation.  Godfrey Pick. Tiburcio Pea, MSN,  FNP-C [facility]

## 2016-09-18 ENCOUNTER — Ambulatory Visit: Payer: BLUE CROSS/BLUE SHIELD

## 2016-10-03 ENCOUNTER — Ambulatory Visit (INDEPENDENT_AMBULATORY_CARE_PROVIDER_SITE_OTHER): Payer: BLUE CROSS/BLUE SHIELD | Admitting: Physician Assistant

## 2016-10-03 VITALS — BP 110/68 | HR 93 | Temp 98.5°F | Resp 18 | Ht 70.05 in | Wt 127.0 lb

## 2016-10-03 DIAGNOSIS — F988 Other specified behavioral and emotional disorders with onset usually occurring in childhood and adolescence: Secondary | ICD-10-CM | POA: Diagnosis not present

## 2016-10-03 DIAGNOSIS — F329 Major depressive disorder, single episode, unspecified: Secondary | ICD-10-CM

## 2016-10-03 MED ORDER — BUPROPION HCL ER (XL) 150 MG PO TB24: 150.0000 mg | ORAL_TABLET | Freq: Every day | ORAL | 0 refills | Status: DC

## 2016-10-03 MED ORDER — LISDEXAMFETAMINE DIMESYLATE 30 MG PO CAPS: 30.0000 mg | ORAL_CAPSULE | Freq: Every day | ORAL | 0 refills | Status: DC

## 2016-10-03 NOTE — Patient Instructions (Signed)
     IF you received an x-ray today, you will receive an invoice from [facility]. Please contact [facility] at [phone] with questions or concerns regarding your invoice.   IF you received labwork today, you will receive an invoice from [facility]. Please contact [facility] at [phone] with questions or concerns regarding your invoice.   Our billing staff will not be able to assist you with questions regarding bills from these companies.  You will be contacted with the lab results as soon as they are available. The fastest way to get your results is to activate your My Chart account. Instructions are located on the last page of this paperwork. If you have not heard from us regarding the results in 2 weeks, please contact this office.      

## 2016-10-03 NOTE — Progress Notes (Signed)
Bruce Warner  MRN: [id number] DOB: [birth year]  Subjective:  Pt presents to clinic for medication refill.  He has felt like the reactive depression was related to the Adderall because when he was on the Adderall he felt bad and when he stopped it he felt better - since starting the Vyvanse he is at the level he was when he would not take the adderall.  He feels like it is becoming more of a problem and he thinks that he would like to start on medications.  Motivation is better now that he has a job - no motivation on the days off - the Vyvanse did not make a difference in that.  He does not had sadness and he has no thoughts of hurting himself or others.   He has had panic attacks - 2 episodes in the last year - anxiety is not something that affects him every day.  Did Freshman year at [employer] - did not do well - liked it but he felt like it was not for him - thinking about getting back to school at [employer] but not sure when he will do this.  Review of Systems  Constitutional: Negative for appetite change, chills, fever and unexpected weight change.  Respiratory: Negative for cough.   Cardiovascular: Negative for chest pain and leg swelling.  Psychiatric/Behavioral: Positive for decreased concentration (well treated on Vyvanse) and dysphoric mood (daily). Negative for sleep disturbance and suicidal ideas. The patient is nervous/anxious (rare).     Patient Active Problem List   Diagnosis Date Noted  . Attention deficit disorder 03/11/2016  . 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 SVT (supraventricular tachycardia) (HCC) 03/14/2015  . History of aplastic anemia 03/08/2013  . 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 diphenhydramine-acetaminophen (TYLENOL PM) 25-500 MG TABS tablet Take 1 tablet by mouth at bedtime as needed.     Marland Kitchen. ibuprofen (ADVIL,MOTRIN) 200 MG tablet Take 200 mg by mouth every 6 (six) hours as needed.     No current facility-administered medications on file prior to visit.     No Known Allergies  Pt patients past, family and social history were reviewed and updated.   Objective:  BP 110/68 (BP Location: Right Arm, Patient Position: Sitting, Cuff Size: Small)   Pulse 93   Temp 98.5 F (36.9 C) (Oral)   Resp 18   Ht 5' 10.05" (1.779 m)   Wt 127 lb (57.6 kg)   SpO2 (!) 89%   BMI 18.20 kg/m   Physical Exam  Constitutional: He is oriented to person, place, and time and well-developed, well-nourished, and in no distress.  HENT:  Head: Normocephalic and atraumatic.  Right Ear: External ear normal.  Left Ear: External ear normal.  Eyes: Conjunctivae are normal.  Neck: Normal range of motion.  Pulmonary/Chest: Effort normal.  Neurological: He is alert and oriented to person, place, and time. Gait normal.  Skin: Skin is warm and dry.  Psychiatric: Mood, memory, affect and judgment normal.    Assessment and Plan :  Attention deficit disorder (ADD) without hyperactivity - Plan: lisdexamfetamine (VYVANSE) 30 MG capsule  Reactive depression - Plan: buPROPion (WELLBUTRIN XL) 150 MG 24 hr tablet   Continue current ADD medications - pt seems to have low motivation/low energy depression - we will start Wellbutrin to see if we can improve his energy  levels.  We we will recheck him in a month to make sure he is tolerating the addition ok.   We discussed controlled substance policy and that he needs to find a single provider for his medical care - he signed a controlled substance contract today while in the office.  Benny LennertSarah Yonah Tangeman PA-C  [facility] 10/03/2016 4:45 PM

## 2016-11-07 ENCOUNTER — Ambulatory Visit (INDEPENDENT_AMBULATORY_CARE_PROVIDER_SITE_OTHER): Payer: BLUE CROSS/BLUE SHIELD | Admitting: Family Medicine

## 2016-11-07 VITALS — BP 108/64 | HR 111 | Temp 98.3°F | Resp 16 | Ht 71.0 in | Wt 128.0 lb

## 2016-11-07 DIAGNOSIS — F988 Other specified behavioral and emotional disorders with onset usually occurring in childhood and adolescence: Secondary | ICD-10-CM

## 2016-11-07 DIAGNOSIS — H6120 Impacted cerumen, unspecified ear: Secondary | ICD-10-CM

## 2016-11-07 DIAGNOSIS — F5101 Primary insomnia: Secondary | ICD-10-CM

## 2016-11-07 DIAGNOSIS — F339 Major depressive disorder, recurrent, unspecified: Secondary | ICD-10-CM | POA: Diagnosis not present

## 2016-11-07 MED ORDER — LISDEXAMFETAMINE DIMESYLATE 30 MG PO CAPS: 30.0000 mg | ORAL_CAPSULE | Freq: Every day | ORAL | 0 refills | Status: DC

## 2016-11-07 MED ORDER — ESCITALOPRAM OXALATE 20 MG PO TABS: 20.0000 mg | ORAL_TABLET | Freq: Every day | ORAL | 1 refills | Status: DC

## 2016-11-07 MED ORDER — CARBAMIDE PEROXIDE 6.5 % OT SOLN: 5.0000 [drp] | Freq: Two times a day (BID) | OTIC | 1 refills | Status: AC

## 2016-11-07 NOTE — Progress Notes (Signed)
   Patient ID: Bruce Warner, male    DOB: Mar 18, 1996, 20 y.o.   MRN: [id number]  PCP: Eula Listenyan Dunn, PA-C  Chief Complaint  Patient presents with  . Medication Refill    vyvanse, pt also wants to discuss wellbutrin    Subjective:   HPI 20 year old presents for evaluation of  ADHD, Depression, and cerumen buildup. Feels a mix of both anxiety an depression. Decreased motivation and lack of excitement. Lack of pleasure with social activities involving friends. Experiences fatigue and reports that he  lays around if he has nothing pressing to do. Chronically takes sleeping aid pills to fall asleep.  Wellbutrin was causing nausea although helping with smoking.  He would like to try a new antidepressant.  Continues Vyvanse for ADHD. Eats two meals per day. Alcohol use 12 beers weekly   Review of Systems See HPI   Patient Active Problem List   Diagnosis Date Noted  . Attention deficit disorder 03/11/2016  . 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 SVT (supraventricular tachycardia) (HCC) 03/14/2015  . History of aplastic anemia 03/08/2013  . 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 Authorizing Provider  buPROPion (WELLBUTRIN XL) 150 MG 24 hr tablet Take 1 tablet (150 mg total) by mouth daily. 10/03/16  Yes Morrell RiddleSarah L Weber, PA-C  diphenhydramine-acetaminophen (TYLENOL PM) 25-500 MG TABS tablet Take 1 tablet by mouth at bedtime as needed.   Yes Historical Provider, MD  ibuprofen (ADVIL,MOTRIN) 200 MG tablet Take 200 mg by mouth every 6 (six) hours as needed.   Yes Historical Provider, MD  lisdexamfetamine (VYVANSE) 30 MG capsule Take 1 capsule (30 mg total) by mouth daily. 10/03/16  Yes Morrell RiddleSarah L Weber, PA-C   No Known Allergies     Objective:  Physical Exam  Constitutional: He appears well-developed and well-nourished.  HENT:  Head:  Normocephalic and atraumatic.  Right Ear: External ear normal.  Left Ear: External ear normal.  Mouth/Throat: Oropharynx is clear and moist.  Neck: Normal range of motion. Neck supple.  Cardiovascular: Normal rate, regular rhythm and normal heart sounds.   Pulmonary/Chest: Effort normal and breath sounds normal.  Abdominal: Soft.  Psychiatric: His behavior is normal. Judgment and thought content normal.  Flat Affect           Assessment & Plan:  1. Attention deficit disorder (ADD) without hyperactivity - lisdexamfetamine (VYVANSE) 30 MG capsule; Take 1 capsule (30 mg total) by mouth daily.  Dispense: 30 capsule; Refill: 0  2. Primary insomnia -Taking OTC sleeping aids and will follow-up if as needed.  3. Recurrent major depressive disorder, remission status unspecified (HCC) -Escitalopram (Lexapro) 20 mg daily for depression.  4. Impacted cerumen, unspecified laterality - Carbamide peroxide (Debrox) 6.5 % otic solution   Godfrey PickKimberly S. Tiburcio PeaHarris, MSN, FNP-C [facility]

## 2016-11-07 NOTE — Patient Instructions (Addendum)
Discontinue Wellbutrin and Escitalopram (Lexapro)  20 mg daily for depression.  I have refilled your Vyvanse .    IF you received an x-ray today, you will receive an invoice from [facility]. Please contact [facility] at [phone] with questions or concerns regarding your invoice.   IF you received labwork today, you will receive an invoice from [facility]. Please contact [facility] at [phone] with questions or concerns regarding your invoice.   Our billing staff will not be able to assist you with questions regarding bills from these companies.  You will be contacted with the lab results as soon as they are available. The fastest way to get your results is to activate your My Chart account. Instructions are located on the last page of this paperwork. If you have not heard from us regarding the results in 2 weeks, please contact this office.     Escitalopram tablets What is this medicine? ESCITALOPRAM (es sye TAL oh pram) is used to treat depression and certain types of anxiety. This medicine may be used for other purposes; ask your health care provider or pharmacist if you have questions. COMMON BRAND NAME(S): Lexapro What should I tell my health care provider before I take this medicine? They need to know if you have any of these conditions: -bipolar disorder or a family history of bipolar disorder -diabetes -glaucoma -heart disease -kidney or liver disease -receiving electroconvulsive therapy -seizures (convulsions) -suicidal thoughts, plans, or attempt by you or a family member -an unusual or allergic reaction to escitalopram, the related drug citalopram, other medicines, foods, dyes, or preservatives -pregnant or trying to become pregnant -breast-feeding How should I use this medicine? Take this medicine by mouth with a glass of water. Follow the directions on the prescription label. You can take it with or without food. If it upsets your stomach, take  it with food. Take your medicine at regular intervals. Do not take it more often than directed. Do not stop taking this medicine suddenly except upon the advice of your doctor. Stopping this medicine too quickly may cause serious side effects or your condition may worsen. A special MedGuide will be given to you by the pharmacist with each prescription and refill. Be sure to read this information carefully each time. Talk to your pediatrician regarding the use of this medicine in children. Special care may be needed. Overdosage: If you think you have taken too much of this medicine contact a poison control center or emergency room at once. NOTE: This medicine is only for you. Do not share this medicine with others. What if I miss a dose? If you miss a dose, take it as soon as you can. If it is almost time for your next dose, take only that dose. Do not take double or extra doses. What may interact with this medicine? Do not take this medicine with any of the following medications: -certain medicines for fungal infections like fluconazole, itraconazole, ketoconazole, posaconazole, voriconazole -cisapride -citalopram -dofetilide -dronedarone -linezolid -MAOIs like Carbex, Eldepryl, Marplan, Nardil, and Parnate -methylene blue (injected into a vein) -pimozide -thioridazine -ziprasidone This medicine may also interact with the following medications: -alcohol -amphetamines -aspirin and aspirin-like medicines -carbamazepine -certain medicines for depression, anxiety, or psychotic disturbances -certain medicines for migraine headache like almotriptan, eletriptan, frovatriptan, naratriptan, rizatriptan, sumatriptan, zolmitriptan -certain medicines for sleep -certain medicines that treat or prevent blood clots like warfarin, enoxaparin, dalteparin -cimetidine -diuretics -fentanyl -furazolidone -isoniazid -lithium -metoprolol -NSAIDs, medicines for pain and inflammation, like ibuprofen or  naproxen -  other medicines that prolong the QT interval (cause an abnormal heart rhythm) -procarbazine -rasagiline -supplements like St. John's wort, kava kava, valerian -tramadol -tryptophan This list may not describe all possible interactions. Give your health care provider a list of all the medicines, herbs, non-prescription drugs, or dietary supplements you use. Also tell them if you smoke, drink alcohol, or use illegal drugs. Some items may interact with your medicine. What should I watch for while using this medicine? Tell your doctor if your symptoms do not get better or if they get worse. Visit your doctor or health care professional for regular checks on your progress. Because it may take several weeks to see the full effects of this medicine, it is important to continue your treatment as prescribed by your doctor. Patients and their families should watch out for new or worsening thoughts of suicide or depression. Also watch out for sudden changes in feelings such as feeling anxious, agitated, panicky, irritable, hostile, aggressive, impulsive, severely restless, overly excited and hyperactive, or not being able to sleep. If this happens, especially at the beginning of treatment or after a change in dose, call your health care professional. Bonita QuinYou may get drowsy or dizzy. Do not drive, use machinery, or do anything that needs mental alertness until you know how this medicine affects you. Do not stand or sit up quickly, especially if you are an older patient. This reduces the risk of dizzy or fainting spells. Alcohol may interfere with the effect of this medicine. Avoid alcoholic drinks. Your mouth may get dry. Chewing sugarless gum or sucking hard candy, and drinking plenty of water may help. Contact your doctor if the problem does not go away or is severe. What side effects may I notice from receiving this medicine? Side effects that you should report to your doctor or health care professional as  soon as possible: -allergic reactions like skin rash, itching or hives, swelling of the face, lips, or tongue -anxious -black, tarry stools -changes in vision -confusion -elevated mood, decreased need for sleep, racing thoughts, impulsive behavior -eye pain -fast, irregular heartbeat -feeling faint or lightheaded, falls -feeling agitated, angry, or irritable -hallucination, loss of contact with reality -loss of balance or coordination -loss of memory -painful or prolonged erections -restlessness, pacing, inability to keep still -seizures -stiff muscles -suicidal thoughts or other mood changes -trouble sleeping -unusual bleeding or bruising -unusually weak or tired -vomiting Side effects that usually do not require medical attention (report to your doctor or health care professional if they continue or are bothersome): -changes in appetite -change in sex drive or performance -headache -increased sweating -indigestion, nausea -tremors This list may not describe all possible side effects. Call your doctor for medical advice about side effects. You may report side effects to FDA at 1-800-FDA-1088. Where should I keep my medicine? Keep out of reach of children. Store at room temperature between 15 and 30 degrees C (59 and 86 degrees F). Throw away any unused medicine after the expiration date. NOTE: This sheet is a summary. It may not cover all possible information. If you have questions about this medicine, talk to your doctor, pharmacist, or health care provider.  2017 Elsevier/Gold Standard (2016-04-08 13:20:23)

## 2016-11-25 ENCOUNTER — Ambulatory Visit (INDEPENDENT_AMBULATORY_CARE_PROVIDER_SITE_OTHER): Payer: BLUE CROSS/BLUE SHIELD | Admitting: Family Medicine

## 2016-11-25 VITALS — BP 118/76 | HR 94 | Temp 98.0°F | Resp 16 | Ht 71.0 in | Wt 125.0 lb

## 2016-11-25 DIAGNOSIS — F418 Other specified anxiety disorders: Secondary | ICD-10-CM | POA: Diagnosis not present

## 2016-11-25 DIAGNOSIS — F988 Other specified behavioral and emotional disorders with onset usually occurring in childhood and adolescence: Secondary | ICD-10-CM

## 2016-11-25 DIAGNOSIS — G4700 Insomnia, unspecified: Secondary | ICD-10-CM

## 2016-11-25 MED ORDER — TRAZODONE HCL 50 MG PO TABS: 25.0000 mg | ORAL_TABLET | Freq: Every evening | ORAL | 3 refills | Status: DC | PRN

## 2016-11-25 MED ORDER — LISDEXAMFETAMINE DIMESYLATE 30 MG PO CAPS: 30.0000 mg | ORAL_CAPSULE | Freq: Every day | ORAL | 0 refills | Status: DC

## 2016-11-25 MED ORDER — ESCITALOPRAM OXALATE 20 MG PO TABS: 20.0000 mg | ORAL_TABLET | Freq: Every day | ORAL | 3 refills | Status: DC

## 2016-11-25 NOTE — Patient Instructions (Addendum)
  Take care and best wishes with your move!   IF you received an x-ray today, you will receive an invoice from [facility]. Please contact [facility] at [phone] with questions or concerns regarding your invoice.   IF you received labwork today, you will receive an invoice from [facility]. Please contact [facility] at [phone] with questions or concerns regarding your invoice.   Our billing staff will not be able to assist you with questions regarding bills from these companies.  You will be contacted with the lab results as soon as they are available. The fastest way to get your results is to activate your My Chart account. Instructions are located on the last page of this paperwork. If you have not heard from us regarding the results in 2 weeks, please contact this office.

## 2016-11-25 NOTE — Progress Notes (Signed)
sleeping pill not on list

## 2016-11-25 NOTE — Progress Notes (Signed)
Patient ID: Bruce Warner F Marrocco, male    DOB: Jan 01, 1996, 21 y.o.   MRN: [id number]  PCP: Eula Listenyan Dunn, PA-C  Chief Complaint  Patient presents with  . Medication Refill    Subjective:   HPI 21 year old male presents for medication refill. This is his last visit here PCP. [place] is moving to [place]. Depression symptoms have significantly subsided since starting on Lexapro. Feels that he is having occasional episodes of Erectile Dysfunction and is interested in having the option of decreased does to 10 mg to see if this symptom will subside.Disinterested in starting another medication as he reports a noticeable improvement in his depression and anxiety.  Social History   Social History  . Marital status: Single    Spouse name: N/A  . Number of children: N/A  . Years of education: N/A   Occupational History  .      [employer]   Social History Main Topics  . Smoking status: Current Every Day Smoker    Types: Cigarettes  . Smokeless tobacco: Never Used     Comment: 1/2 pack per day  . Alcohol use 1.2 oz/week    2 Standard drinks or equivalent per week     Comment: weekly - binge drinking - 10 beers at a time  . Drug use: No     Comment: Marijuana - past use   . Sexual activity: No   Other Topics Concern  . Not on file   Social History Narrative   Lives with sister    Family History  Problem Relation Age of Onset  . Hypertension Maternal Grandfather   . Heart disease Paternal Grandfather   . Anxiety disorder Mother   . ADD / ADHD Sister   . Depression Sister   . Anxiety disorder Sister   . Alcohol abuse Sister   . ADD / ADHD Brother   . AAA (abdominal aortic aneurysm) Brother   . Hyperlipidemia Maternal Grandmother   . Hypertension Maternal Grandmother   . Stroke Maternal Grandmother    Review of Systems See HPI  Patient Active Problem List   Diagnosis Date Noted  . Attention deficit disorder 03/11/2016  . Adjustment reaction of adolescence with mixed  disturbance of emotions and conduct 03/11/2016  . SVT (supraventricular tachycardia) (HCC) 03/14/2015  . History of aplastic anemia 03/08/2013  . 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 Authorizing Provider  carbamide peroxide (DEBROX) 6.5 % otic solution Place 5 drops into both ears 2 (two) times daily. 11/07/16  Yes Doyle AskewKimberly Stephenia Cina Klumpp, FNP  diphenhydramine-acetaminophen (TYLENOL PM) 25-500 MG TABS tablet Take 1 tablet by mouth at bedtime as needed.   Yes Historical Provider, MD  escitalopram (LEXAPRO) 20 MG tablet Take 1 tablet (20 mg total) by mouth daily. 11/07/16  Yes Doyle AskewKimberly Stephenia Myrtice Lowdermilk, FNP  ibuprofen (ADVIL,MOTRIN) 200 MG tablet Take 200 mg by mouth every 6 (six) hours as needed.   Yes Historical Provider, MD  lisdexamfetamine (VYVANSE) 30 MG capsule Take 1 capsule (30 mg total) by mouth daily. 11/07/16  Yes Doyle AskewKimberly Stephenia Kiosha Buchan, FNP    No Known Allergies     Objective:  Physical Exam  Constitutional: He is oriented to person, place, and time. He appears well-developed and well-nourished.  HENT:  Head: Normocephalic and atraumatic.  Right Ear: External ear normal.  Left Ear: External ear normal.  Nose: Nose normal.  Mouth/Throat: Oropharynx  is clear and moist.  Eyes: Pupils are equal, round, and reactive to light.  Neck: Normal range of motion. Neck supple.  Cardiovascular: Normal rate, regular rhythm, normal heart sounds and intact distal pulses.   Pulmonary/Chest: Effort normal and breath sounds normal.  Musculoskeletal: Normal range of motion.  Neurological: He is alert and oriented to person, place, and time.  Skin: Skin is warm and dry.  Psychiatric: He has a normal mood and affect. His behavior is normal. Judgment and thought content normal.      Assessment & Plan:  1. Attention deficit disorder (ADD) without hyperactivity - lisdexamfetamine (VYVANSE)  30 MG capsule; Take 1 capsule (30 mg total) by mouth daily. Do not fill before 12/08/16-01/08/17  Dispense: 30 capsule; Refill: 0  2. Insomnia, unspecified type -Trazodone (Desyrel) 50 mg at bedtime as needed for sleep.  3. Depression with anxiety -Escitalopram 20 mg once daily.   Godfrey Pick. Tiburcio Pea, MSN, FNP-C [facility] at [facility] [phone]

## 2019-04-08 ENCOUNTER — Encounter: Payer: BLUE CROSS/BLUE SHIELD | Admitting: Family Medicine

## 2019-07-29 ENCOUNTER — Telehealth (HOSPITAL_COMMUNITY): Payer: Self-pay | Admitting: Professional

## 2019-07-29 NOTE — Telephone Encounter (Signed)
Cln returned pt call. Cln oriented pt to group and discussed cost. Cln encourages pt to call insurance to check on benefits. Pt agrees to do so. Pt wants to discuss group with therapist, [name], before making decision. Pt reports hospitalization earlier in the year for SI/depression. Pt shares SI is returning. Pt's psychiatrist "dropped me today" so is in search of a new provider. Cln offers to help; pt reports he has already called and left msgs for a few. Cln provides number for pt to call back to schedule ax if wants to move forward with group ([phone]). Pt agrees. Pt denies current SI/HI/AVH.

## 2019-07-30 ENCOUNTER — Telehealth (HOSPITAL_COMMUNITY): Payer: Self-pay | Admitting: Professional

## 2019-08-04 ENCOUNTER — Other Ambulatory Visit: Payer: Self-pay

## 2019-08-04 ENCOUNTER — Other Ambulatory Visit (HOSPITAL_COMMUNITY): Payer: BC Managed Care – PPO | Attending: Psychiatry | Admitting: Licensed Clinical Social Worker

## 2019-08-04 ENCOUNTER — Telehealth (HOSPITAL_COMMUNITY): Payer: Self-pay | Admitting: Professional

## 2019-08-04 DIAGNOSIS — F17210 Nicotine dependence, cigarettes, uncomplicated: Secondary | ICD-10-CM | POA: Diagnosis not present

## 2019-08-04 DIAGNOSIS — R45851 Suicidal ideations: Secondary | ICD-10-CM | POA: Diagnosis not present

## 2019-08-04 DIAGNOSIS — F332 Major depressive disorder, recurrent severe without psychotic features: Secondary | ICD-10-CM

## 2019-08-04 DIAGNOSIS — Z9114 Patient's other noncompliance with medication regimen: Secondary | ICD-10-CM | POA: Diagnosis not present

## 2019-08-04 DIAGNOSIS — Z79899 Other long term (current) drug therapy: Secondary | ICD-10-CM | POA: Diagnosis not present

## 2019-08-04 DIAGNOSIS — G4700 Insomnia, unspecified: Secondary | ICD-10-CM | POA: Insufficient documentation

## 2019-08-04 DIAGNOSIS — Z658 Other specified problems related to psychosocial circumstances: Secondary | ICD-10-CM | POA: Diagnosis not present

## 2019-08-04 DIAGNOSIS — F330 Major depressive disorder, recurrent, mild: Secondary | ICD-10-CM | POA: Insufficient documentation

## 2019-08-04 DIAGNOSIS — R4589 Other symptoms and signs involving emotional state: Secondary | ICD-10-CM | POA: Insufficient documentation

## 2019-08-05 ENCOUNTER — Other Ambulatory Visit (HOSPITAL_COMMUNITY): Payer: BC Managed Care – PPO | Admitting: Licensed Clinical Social Worker

## 2019-08-05 ENCOUNTER — Other Ambulatory Visit (HOSPITAL_COMMUNITY): Payer: BC Managed Care – PPO | Admitting: Occupational Therapy

## 2019-08-05 ENCOUNTER — Other Ambulatory Visit: Payer: Self-pay

## 2019-08-05 DIAGNOSIS — R4589 Other symptoms and signs involving emotional state: Secondary | ICD-10-CM

## 2019-08-05 DIAGNOSIS — F332 Major depressive disorder, recurrent severe without psychotic features: Secondary | ICD-10-CM

## 2019-08-05 NOTE — Psych (Signed)
Virtual Visit via Video Note  I connected with Bruce Warner on 08/05/19 at  9:00 AM EDT by a video enabled telemedicine application and verified that I am speaking with the correct person using two identifiers.   I discussed the limitations of evaluation and management by telemedicine and the availability of in person appointments. The patient expressed understanding and agreed to proceed.  I discussed the assessment and treatment plan with the patient. The patient was provided an opportunity to ask questions and all were answered. The patient agreed with the plan and demonstrated an understanding of the instructions.   The patient was advised to call back or seek an in-person evaluation if the symptoms worsen or if the condition fails to improve as anticipated.  I provided 10 minutes of non-face-to-face time during this encounter.  Pt verbally agrees to treatment plan.  [place], LCMHCA, LCASA
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
## 2019-08-06 ENCOUNTER — Encounter (HOSPITAL_COMMUNITY): Payer: Self-pay | Admitting: Family

## 2019-08-06 ENCOUNTER — Other Ambulatory Visit (HOSPITAL_COMMUNITY): Payer: BC Managed Care – PPO | Admitting: Licensed Clinical Social Worker

## 2019-08-06 ENCOUNTER — Other Ambulatory Visit (HOSPITAL_COMMUNITY): Payer: BC Managed Care – PPO | Admitting: Occupational Therapy

## 2019-08-06 ENCOUNTER — Encounter (HOSPITAL_COMMUNITY): Payer: Self-pay | Admitting: Occupational Therapy

## 2019-08-06 ENCOUNTER — Other Ambulatory Visit: Payer: Self-pay

## 2019-08-06 ENCOUNTER — Telehealth (HOSPITAL_COMMUNITY): Payer: Self-pay | Admitting: Professional

## 2019-08-06 DIAGNOSIS — R4589 Other symptoms and signs involving emotional state: Secondary | ICD-10-CM

## 2019-08-06 DIAGNOSIS — F332 Major depressive disorder, recurrent severe without psychotic features: Secondary | ICD-10-CM

## 2019-08-06 NOTE — Progress Notes (Signed)
Virtual Visit via Video Note  I connected with Bruce Warner on 08/06/19 at  9:00 AM EDT by a video enabled telemedicine application and verified that I am speaking with the correct person using two identifiers.   I discussed the limitations of evaluation and management by telemedicine and the availability of in person appointments. The patient expressed understanding and agreed to proceed.  I discussed the assessment and treatment plan with the patient. The patient was provided an opportunity to ask questions and all were answered. The patient agreed with the plan and demonstrated an understanding of the instructions.   The patient was advised to call back or seek an in-person evaluation if the symptoms worsen or if the condition fails to improve as anticipated.  I provided 30 minutes of non-face-to-face time during this encounter.   Oneta Rackanika N Lewis, NP    Behavioral Health Partial Program Assessment Note  Date: 08/06/2019 Name: Bruce Warner MRN: [id number]   HPI: Bruce Warner  is a 23 y.o. Caucasian male presents with worsening  depression and passive suicidal ideations.  He reports feeling suicidal roughly about 2 weeks ago.  Continues to express depression and depressive symptoms.  He reports struggling with depression since the sixth grade.  He reports he has been prescribed Lexapro, Wellbutrin, Pristiq, Lexapro without any symptom relief.  He reports he was recently discharged from his psychiatrist practice due to noncompliance with medication.  And stated" I refused to tell her my plan" avoids to harm him his self.  He denies auditory or visual hallucinations.  Reports restless sleep where he states he has been taking Benadryl and over-the-counter Unisom to aid with his symptoms.  Discussed initiating Vistaril 25 to 50 mg p.o. nightly patient was receptive to plan.  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  Patient reports he recently contacted Dr. Marlyne BeardsJennings office for follow-up care. patient was enrolled in partial psychiatric program on 08/06/19.  Primary complaints include: anxiety attacks and increased irritability.  Onset of symptoms was gradual with gradually worsening course since that time.  Denies history of physical or sexual abuse in the past.  Reports a recent inpatient admission.  Psychosocial Stressors include the following: family.  At home reports a family history of mental illness.  He reports brother 30 years ago suffers from ADHD and depression, sister 23 years old suffers from depression, mother: Depression and anxiety.  Father: ADHD  I have reviewed the following documentation dated: past psychiatric history, past medical history and past social and family history  Complaints of Pain: nonear Past Psychiatric History:  Past psychiatric hospitalizations   Currently in treatment with Vyvanse 30 mg and Lexapro 20 mg   Substance Abuse History: marijuana Use of Alcohol: denied Use of Caffeine: denies use Use of over the counter:   Past Surgical History:  Procedure Laterality Date  . 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 CARDIAC ELECTROPHYSIOLOGY MAPPING AND ABLATION  2009   for svt    Past Medical History:  Diagnosis Date  . Aplastic anemia (HCC) 2005   s/p transplant 2005  . Seasonal allergies   . SVT (supraventricular tachycardia) (HCC) 2009   s/p ablation 2009   Outpatient Encounter Medications as of 08/06/2019  Medication Sig  . carbamide peroxide (DEBROX) 6.5 % otic solution Place 5 drops into both ears 2 (two) times daily.  . diphenhydramine-acetaminophen (  TYLENOL PM) 25-500 MG TABS tablet Take 1 tablet by mouth at bedtime as needed.  Marland Kitchen escitalopram (LEXAPRO) 20 MG tablet Take 1 tablet (20 mg total) by mouth daily.  Marland Kitchen ibuprofen (ADVIL,MOTRIN) 200 MG tablet Take 200 mg by mouth every 6 (six) hours as needed.  Marland Kitchen lisdexamfetamine  (VYVANSE) 30 MG capsule Take 1 capsule (30 mg total) by mouth daily. Do not fill before 12/08/16-01/08/17  . lisdexamfetamine (VYVANSE) 30 MG capsule Take 1 capsule (30 mg total) by mouth daily. Fill dates 01/08/17-02/03/17  . lisdexamfetamine (VYVANSE) 30 MG capsule Take 1 capsule (30 mg total) by mouth daily. 02/03/17-03/05/17 Fill dates  . traZODone (DESYREL) 50 MG tablet Take 0.5-1 tablets (25-50 mg total) by mouth at bedtime as needed for sleep.   No facility-administered encounter medications on file as of 08/06/2019.    No Known Allergies  Social History   Tobacco Use  . Smoking status: Current Every Day Smoker    Types: Cigarettes  . Smokeless tobacco: Never Used  . Tobacco comment: 1/2 pack per day  Substance Use Topics  . Alcohol use: Yes    Alcohol/week: 2.0 standard drinks    Types: 2 Standard drinks or equivalent per week    Comment: weekly - binge drinking - 10 beers at a time   Functioning Relationships: good support system Education: High School:  Other Pertinent History: None Family History  Problem Relation Age of Onset  . Hypertension Maternal Grandfather   . Heart disease Paternal Grandfather   . Anxiety disorder Mother   . ADD / ADHD Sister   . Depression Sister   . Anxiety disorder Sister   . Alcohol abuse Sister   . ADD / ADHD Brother   . AAA (abdominal aortic aneurysm) Brother   . Hyperlipidemia Maternal Grandmother   . Hypertension Maternal Grandmother   . Stroke Maternal Grandmother      Review of Systems Constitutional: negative  Objective:  There were no vitals filed for this visit.  Physical Exam:   Mental Status Exam: Appearance:  Well groomed Psychomotor::  Within Normal Limits Attention span and concentration: Normal Behavior: calm and cooperative Speech:  normal pitch Mood:  depressed and anxious Affect:  normal Thought Process:  Coherent Thought Content:  Logical Orientation:  person, place and time/date Cognition:  grossly  intact Insight:  Fair Judgment:  Fair Estimate of Intelligence: Average Fund of knowledge: Aware of current events Memory: Recent and remote intact Abnormal movements: None Gait and station: Normal  Assessment:  Diagnosis: No primary diagnosis found. No diagnosis found.  Indications for admission: inpatient care required if not in partial hospital program  Plan: patient enrolled in Partial Hospitalization Program, patient's current medications are to be continued, a comprehensive treatment plan will be developed and side effects of medications have been reviewed with patient Increased  Trintellix 10 mg to 20 mg po daily Initiated Vistaril 25 mg po QHS for insomnia   Treatment options and alternatives reviewed with patient and patient understands the above plan.Treatment plan was reviewed and agreed upon by T.Bobby Rumpf and Casandra Doffing need for group services    Derrill Center, NP

## 2019-08-06 NOTE — Psych (Signed)
Virtual Visit via Video Note  I connected with Bruce CuriaAdam F Warner on 08/06/19 at  9:00 AM EDT by a video enabled telemedicine application and verified that I am speaking with the correct person using two identifiers.   I discussed the limitations of evaluation and management by telemedicine and the availability of in person appointments. The patient expressed understanding and agreed to proceed.   I discussed the assessment and treatment plan with the patient. The patient was provided an opportunity to ask questions and all were answered. The patient agreed with the plan and demonstrated an understanding of the instructions.   The patient was advised to call back or seek an in-person evaluation if the symptoms worsen or if the condition fails to improve as anticipated.  Pt was provided 240 minutes of non-face-to-face time during this encounter.   Donia GuilesJenny Ilma Achee, LCSW    [facility] PHP THERAPIST PROGRESS NOTE  Bruce Warner [id number]  Session Time: 9:00 - 10:00  Participation Level: Active  Behavioral Response: CasualAlertDepressed  Type of Therapy: Group Therapy  Treatment Goals addressed: Coping  Interventions: CBT, DBT, Supportive and Reframing  Summary: Clinician led check-in regarding current stressors and situation, and review of patient completed daily inventory. Clinician utilized active listening and empathetic response and validated patient emotions. Clinician facilitated processing group on pertinent issues.   Therapist Response: Bruce Curiadam F Dress is a 23 y.o. male who presents with depression symptoms.  Patient arrived within time allowed and reports that he is feeling "okay." Patient rates his mood at a 5 on a scale of 1-10 with 10 being great. Pt reports yesterday was "normal" and he played video games all afternoon and evening. Pt states he ate in his room and did not do anything else. Pt struggles with finding things to look forward to. Patient engaged in  discussion.      Session Time: 10:00 -11:00  Participation Level: Active  Behavioral Response: CasualAlertDepressed  Type of Therapy: Group Therapy, psychoeducation, psychotherapy  Treatment Goals addressed: Coping  Interventions: CBT, DBT, Solution Focused, Supportive and Reframing  Summary: Cln led discussion on communication issues within relationships. Cln brought in "I" statements and fair fighting concepts.   Therapist Response: Pt reports struggling with communication due to feeling he "owes" certain things to people and not wanting to be aggressive. Pt states he often chooses not to communicate. Pt able to process.        Session Time: 11:00- 12:00  Participation Level: Active  Behavioral Response: CasualAlertDepressed  Type of Therapy: Group Therapy, Psychoeducation; Psychotherapy  Treatment Goals addressed: Coping  Interventions: CBT; Solution focused; Supportive; Reframing  Summary: Cln led boundary workshop. Cln discussed how to set and maintain boundaries. Group members discussed boundary issues from their lives and group provided feedback and problem solving based on boundary education.   Therapist Response: Patient engaged in activity. Pt shared boundary issues with his family. Pt accepted and provided feedback and reports increased understanding of how to set and maintain boundaries.       Session Time: 12:00 -1:00  Participation Level:Active  Behavioral Response:CasualAlertDepressed  Type of Therapy: Group Therapy, OT  Treatment Goals addressed: Coping  Interventions:Psychosocial skills training, Supportive,   Summary:12:00 - 12:50:Occupational Therapy group 12:50 -1:00 Clinician led check-out. Clinician assessed for immediate needs, medication compliance and efficacy, and safety concerns   Therapist Response: 12:00 - 12:50: Patient engaged in group. See OT note.  12:50 - 1:00: At check-out, patient  rates his mood at a 5 on a scale  of 1-10 with 10 being great. Pt states afternoon plans of playing video games. Patient demonstrates some progress as evidenced by sharing his discomforts. Patient denies SI/HI/self-harm at the end of group.     Suicidal/Homicidal: Nowithout intent/plan  Plan: Pt will continue in PHP while working to decrease depression symptoms and increase ability to manage symptoms in a healthy manner as they arise.   Diagnosis: Severe episode of recurrent major depressive disorder, without psychotic features ([place]) [F33.2]    1. Severe episode of recurrent major depressive disorder, without psychotic features ([place])       Lorin Glass, LCSW 08/06/2019

## 2019-08-06 NOTE — Progress Notes (Signed)
Spoke with patient via webex video call, used 2 identifiers to correctly identify patient. Pleasant, cooperative smiling and appropriate. States he was referred by his previous psychiatrist Beryle Lathe after he missed 2 doses of his antidepressant and became suicidal. He refused to disclose a plan or intent and his psychiatrist dropped him as a patient after referring him to [facility]. He would like to find a new psychiatrist with [facility] and has placed a call to [name]. Asking if we could help facilitate the transfer. He has been inpatient once in January 2020 in [place] when he lived there with his father. He moved back to [place] with his mother in May. On scale of 1-10 as 10 being worst he rates depression at 7 and anxiety at 2. Denies SI/HI or AV hallucinations. He would like to increase his Trintellix because he does not feel like it is managing his depression like he feels it should. PHQ9=20. No issues or complaints. Group went well yesterday and he has no trouble talking in a group setting and looks forward to learning more and getting help with depression.

## 2019-08-06 NOTE — Psych (Signed)
Virtual Visit via Video Note  I connected with Simone CuriaAdam F Maturin on 08/05/19 at  9:00 AM EDT by a video enabled telemedicine application and verified that I am speaking with the correct person using two identifiers.   I discussed the limitations of evaluation and management by telemedicine and the availability of in person appointments. The patient expressed understanding and agreed to proceed.   I discussed the assessment and treatment plan with the patient. The patient was provided an opportunity to ask questions and all were answered. The patient agreed with the plan and demonstrated an understanding of the instructions.   The patient was advised to call back or seek an in-person evaluation if the symptoms worsen or if the condition fails to improve as anticipated.  Pt was provided 240 minutes of non-face-to-face time during this encounter.   Donia GuilesJenny Senai Ramnath, LCSW    [facility] THERAPIST PROGRESS NOTE  Simone Curiadam F [place] [phone]  Session Time: 9:00 - 10:00  Participation Level: Active  Behavioral Response: CasualAlertDepressed  Type of Therapy: Group Therapy  Treatment Goals addressed: Coping  Interventions: CBT, DBT, Supportive and Reframing  Summary: Clinician led check-in regarding current stressors and situation, and review of patient completed daily inventory. Clinician utilized active listening and empathetic response and validated patient emotions. Clinician facilitated processing group on pertinent issues.   Therapist Response: Simone Curiadam F Kavanagh is a 23 y.o. male who presents with depression symptoms.  Patient arrived within time allowed and reports that he is feeling "kind of weird." Patient rates his mood at a 5 on a scale of 1-10 with 10 being great. Pt reports he had some family stuff come up yesterday that left him unsettled. Pt states his father texted him that his sister was struggling with addiction and SI. Pt reports feeling helpless and that it "weighed on" him all day that  there wasn't anything he could do to help her. Pt able to process.  Pt reports he spent his day playing video games and that is how he spends most of his days as it is an "escape" for him. Patient engaged in discussion.      Session Time: 10:00 -11:00  Participation Level: Active  Behavioral Response: CasualAlertDepressed  Type of Therapy: Group Therapy, psychoeducation, psychotherapy  Treatment Goals addressed: Coping  Interventions: CBT, DBT, Solution Focused, Supportive and Reframing  Summary: Cln led discussion on building trust in relationships and establishing new and healthy relationships. Group members shared ways in which they struggle with relationships and processed.   Therapist Response: Pt reports struggling with establishing trust with people and connects it to the poor relationship with his father. Pt states he has a few friends he trusts but often still chooses not to share things with them. Pt able to process.        Session Time: 11:00- 12:00  Participation Level: Active  Behavioral Response: CasualAlertDepressed  Type of Therapy: Group Therapy, Psychoeducation; Psychotherapy  Treatment Goals addressed: Coping  Interventions: CBT; Solution focused; Supportive; Reframing  Summary: Cln continued topic of boundaries and reviewed previous day topic. Cln discussed how to set and maintain boundaries. Cln utilized handouts "How to set a boundary" and group utilized examples to practice hypothetical situations.  Therapist Response: Patient engaged in activity. Pt demonstrated understanding of how to set a boundary by participating in examples.      Session Time: 12:00 -1:00  Participation Level:Active  Behavioral Response:CasualAlertDepressed  Type of Therapy: Group Therapy, OT  Treatment Goals addressed: Coping  Interventions:Psychosocial skills  training, Supportive,   Summary:12:00 - 12:50:Occupational Therapy  group 12:50 -1:00 Clinician led check-out. Clinician assessed for immediate needs, medication compliance and efficacy, and safety concerns   Therapist Response: 12:00 - 12:50: Patient engaged in group. See OT note.  12:50 - 1:00: At check-out, patient rates his mood at a 4 on a scale of 1-10 with 10 being great. Pt states afternoon plans of playing video games. Patient demonstrates some progress as evidenced by participating in first group session. Patient denies SI/HI/self-harm at the end of group.     Suicidal/Homicidal: Nowithout intent/plan  Plan: Pt will continue in PHP while working to decrease depression symptoms and increase ability to manage symptoms in a healthy manner as they arise.   Diagnosis: Severe episode of recurrent major depressive disorder, without psychotic features ([place]) [F33.2]    1. Severe episode of recurrent major depressive disorder, without psychotic features ([place])       Lorin Glass, LCSW 08/06/2019

## 2019-08-06 NOTE — Therapy (Signed)
[place], [name], [address] Phone: [phone]   Fax:  [phone]  Occupational Therapy Evaluation  Patient Details  Name: Bruce Warner MRN: [id number] Date of Birth: [birth year] Referring Provider (OT): Ricky Ala, NP  Virtual Visit via Video Note  I connected with Bruce Warner on 08/06/19 at  8:00 AM EDT by a video enabled telemedicine application and verified that I am speaking with the correct person using two identifiers.   I discussed the limitations of evaluation and management by telemedicine and the availability of in person appointments. The patient expressed understanding and agreed to proceed.   I discussed the assessment and treatment plan with the patient. The patient was provided an opportunity to ask questions and all were answered. The patient agreed with the plan and demonstrated an understanding of the instructions.   The patient was advised to call back or seek an in-person evaluation if the symptoms worsen or if the condition fails to improve as anticipated.  I provided 90 minutes of non-face-to-face time during this encounter.   Zenovia Jarred, OT    Encounter Date: 08/05/2019  OT End of Session - 08/06/19 0905    Visit Number  1    Number of Visits  12    Date for OT Re-Evaluation  09/03/19    Authorization Type  [employer]    OT Start Time  1100    OT Stop Time  1230    OT Time Calculation (min)  90 min    Activity Tolerance  Patient tolerated treatment well    Behavior During Therapy  WFL for tasks assessed/performed       Past Medical History:  Diagnosis Date  . Aplastic anemia ([place]) 2005   s/p transplant 2005  . Seasonal allergies   . SVT (supraventricular tachycardia) ([place]) 2009   s/p ablation 2009    Past Surgical History:  Procedure Laterality Date  . 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 CARDIAC ELECTROPHYSIOLOGY [place] AND  ABLATION  2009   for svt    There were no vitals filed for this visit.  Subjective Assessment - 08/06/19 0903    Currently in Pain?  No/denies        [facility] OT Assessment - 08/06/19 0001      Assessment   Medical Diagnosis  Severe episode of major depressive disorder, without psychotic features    Referring Provider (OT)  Ricky Ala, NP    Onset Date/Surgical Date  08/06/19      Precautions   Precautions  None      Restrictions   Weight Bearing Restrictions  No      Balance Screen   Has the patient fallen in the past 6 months  No    Has the patient had a decrease in activity level because of a fear of falling?   No    Is the patient reluctant to leave their home because of a fear of falling?   No         OT assessment: OCAIRS  Diagnosis: MDD severe without psychotic features  Past medical history/referral information: Pt presents to PHP After having increased SI and needing higher level of care  Living situation: Lives with aunt and uncle  ADLs/IADLs: Decreased engagement, aunt currently completes all chores/cleaning for pt despite laundry. Pt still does not consistently do laundry  Sleep: has no current routine; will play video games until  exhausted them sleep 12 hours  Work: Currently not working  Leisure: not currently engaging other than excessive video gaming  Social support: limited  Financial traderCAIRS Mental Health Interview Summary of Client Scores:  FACILITATES PARTICIPATION IN OCCUPATION  ALLOWS PARTICIPATION IN OCCUPATION INHIBITS PARTICIPATION IN OCCUPATION RESTRICTS PARTICIPATION IN OCCUPATION COMMENTS  ROLES               X   HABITS               X   PERSONAL CAUSATION              X    VALUES              X    INTERESTS               X   SKILLS               X   SHORT TERM GOALS              X    LONG TERM GOALS              X    INTERPETATION OF PAST EXPERIENCES              X    PHYSICAL ENVIRONMENT              X    SOCIAL ENVIRONMENT               X    READINESS FOR CHANGE              X      Need for Occupational Therapy:  4 Shows positive occupational participation, no need for OT.   3 Need for minimal intervention/consultative participation     X 2 Need for OT intervention indicated to restore/improve participation   1 Need for extensive OT intervention indicated to improve participation.  Referral for follow up services also recommended.    Assessment:  Patient demonstrates behavior that inhibits participation in occupation.  Patient will benefit from occupational therapy intervention in order to improve time management, financial management, stress management, job readiness skills, social skills, and health management skills in preparation to return to full time community living and to be a productive community member.    Plan:  Patient will participate in skilled occupational therapy sessions individually or in a group setting to improve coping skills, psychosocial skills, and emotional skills required to return to prior level of function.  Treatment will be 3 times per week for 4 weeks.     OT TREATMENT  S: My self care is pretty poor right now  O: Education given on self care and its importance in regular BADL/IADL routine. Pt completed self care assessment to identify areas of strength and weakness. Self care assessments covered areas of physical health, psychological health, spiritual health, and professional health. Pt asked to identifies area of weakness within each area and develop plans for improvement this date. Pt encouraged to brainstorm with other peers to begin goal setting in areas of desired change.   A: Pt presents to group with depressed affect, engaged and participatory. He shares how his self care is overall poor. He mentions how his spiritual and professional self care are currently decreased. He would like to improve this by finding things that bring him joy/motivation and working on himself professionally.    P: OT Group will be x3 per week while pt in PHP  OT Education - 08/06/19 0904    Education Details  education given on self care    Person(s) Educated  Patient    Methods  Explanation;Handout    Comprehension  Verbalized understanding       OT Short Term Goals - 08/06/19 0908      OT SHORT TERM GOAL #1   Title  Pt will be educated on strategies to improve psychosocial skills needed to participate fully in all daily, work, and leisure activities    Time  4    Period  Weeks    Status  New    Target Date  09/03/19      OT SHORT TERM GOAL #2   Title  Pt will apply psychosocial skills and coping mechanisms to daily activities in order to function independently and reintegrate into community    Time  4    Period  Weeks    Status  New      OT SHORT TERM GOAL #3   Title  Pt will recall and/or apply 1-3 sleep hygiene strategies to improve function in BADL routine upon reintegrating into community    Time  4    Period  Weeks    Status  New      OT SHORT TERM GOAL #4   Title  Pt will create and implement functional BADL/IADL routine to facilitate successful engagement in community dwelling    Time  4    Period  Weeks    Status  New      OT SHORT TERM GOAL #5   Title  Pt will engage in goal setting to improve functional BADL/IADL routine upon reintegrating into community.    Time  4    Period  Weeks    Status  New               Plan - 08/06/19 [id number]    OT Occupational Profile and History  Detailed Assessment- Review of Records and additional review of physical, cognitive, psychosocial history related to current functional performance    Occupational performance deficits (Please refer to evaluation for details):  ADL's;IADL's;Rest and Sleep;Work;Education;Leisure;Social Participation    Body Structure / Function / Physical Skills  ADL;IADL    Cognitive Skills  Energy/Drive    Psychosocial Skills  Coping Strategies;Interpersonal Interaction;Routines and  Behaviors;Habits;Environmental  Adaptations    Rehab Potential  Good    Clinical Decision Making  Several treatment options, min-mod task modification necessary    Comorbidities Affecting Occupational Performance:  May have comorbidities impacting occupational performance    Modification or Assistance to Complete Evaluation   Min-Moderate modification of tasks or assist with assess necessary to complete eval    OT Frequency  3x / week    OT Duration  4 weeks    OT Treatment/Interventions  Self-care/ADL training;Psychosocial skills training;Coping strategies training;Patient/family education;Other (comment)   community reintegration   Consulted and Agree with Plan of Care  Patient       Patient will benefit from skilled therapeutic intervention in order to improve the following deficits and impairments:   Body Structure / Function / Physical Skills: ADL, IADL Cognitive Skills: Energy/Drive Psychosocial Skills: Coping Strategies, Interpersonal Interaction, Routines and Behaviors, Habits, Environmental  Adaptations   Visit Diagnosis: Severe episode of recurrent major depressive disorder, without psychotic features (H[facility])  Difficulty coping    Problem List Patient Active Problem List   Diagnosis Date Noted  . Major depressive disorder, recurrent episode, severe (HCC) 08/04/2019  . Attention deficit  disorder 03/11/2016  . 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 SVT (supraventricular tachycardia) ([facility]) 03/14/2015  . History of aplastic anemia 03/08/2013  . 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 [place], [name], [address] Phone: [phone]   Fax:  [phone]  Name: Bruce Warner MRN:  [id number] Date of Birth: 1996-07-29

## 2019-08-09 ENCOUNTER — Other Ambulatory Visit (HOSPITAL_COMMUNITY): Payer: BC Managed Care – PPO | Admitting: Licensed Clinical Social Worker

## 2019-08-09 ENCOUNTER — Encounter (HOSPITAL_COMMUNITY): Payer: Self-pay | Admitting: Occupational Therapy

## 2019-08-09 ENCOUNTER — Other Ambulatory Visit: Payer: Self-pay

## 2019-08-09 DIAGNOSIS — F332 Major depressive disorder, recurrent severe without psychotic features: Secondary | ICD-10-CM

## 2019-08-09 MED ORDER — VORTIOXETINE HBR 20 MG PO TABS: 20.0000 mg | ORAL_TABLET | Freq: Every day | ORAL | 0 refills | Status: DC

## 2019-08-09 MED ORDER — HYDROXYZINE PAMOATE 25 MG PO CAPS: 25.0000 mg | ORAL_CAPSULE | Freq: Every evening | ORAL | 0 refills | Status: DC | PRN

## 2019-08-09 NOTE — Psych (Signed)
Virtual Visit via Video Note  I connected with [name] on 08/09/19 at  9:00 AM EDT by a video enabled telemedicine application and verified that I am speaking with the correct person using two identifiers.   I discussed the limitations of evaluation and management by telemedicine and the availability of in person appointments. The patient expressed understanding and agreed to proceed.   I discussed the assessment and treatment plan with the patient. The patient was provided an opportunity to ask questions and all were answered. The patient agreed with the plan and demonstrated an understanding of the instructions.   The patient was advised to call back or seek an in-person evaluation if the symptoms worsen or if the condition fails to improve as anticipated.  Pt was provided 240 minutes of non-face-to-face time during this encounter.   Donia Guiles, LCSW    [facility] PHP THERAPIST PROGRESS NOTE  BARNIE SOPKO [phone]  Session Time: 9:00 - 10:00  Participation Level: Active  Behavioral Response: CasualAlertDepressed  Type of Therapy: Group Therapy  Treatment Goals addressed: Coping  Interventions: CBT, DBT, Supportive and Reframing  Summary: Clinician led check-in regarding current stressors and situation, and review of patient completed daily inventory. Clinician utilized active listening and empathetic response and validated patient emotions. Clinician facilitated processing group on pertinent issues.   Therapist Response: LAWRANCE WIEDEMANN is a 23 y.o. male who presents with depression symptoms.  Patient arrived within time allowed and reports that he is feeling "not great." Patient rates his mood at a 3.5 on a scale of 1-10 with 10 being great. Pt reports he spent his weekend sleeping. Pt states this happens approximately one time a month and he was awake for no more than 4 hours a day. Pt shares his aunt and uncle are worried about him because they tried to wake him up and get  him to eat and he wouldn't which made them feel helpless which he didn't "feel great about." Pt states he is snacking when awake but not eating formal meals currently. Pt struggles with routine, stating he doesn't have reason to get up and do anything. Patient engaged in discussion.       Session Time: 10:00 -11:00  Participation Level: Active  Behavioral Response: CasualAlertDepressed  Type of Therapy: Group Therapy, psychoeducation, psychotherapy  Treatment Goals addressed: Coping  Interventions: CBT, DBT, Solution Focused, Supportive and Reframing  Summary: Cln led discussion on motivation. Group members discussed issues with motivation. Cln had pt's explore what they felt last time they were motivated and seek to recapture or address those feelings instead of waiting for motivation to "happen."   Therapist Response: Patient states he is "not motivated to do anything." Pt shares in the past being motivated for looking for a job, working on his portfolio, and being a "successful member of society." Pt shares things that have changed since he was motivated. 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 Pt goes back and forth with vulnerability in session and will be open and then contradict himself and argue a different point later. Pt struggles to consider there is anything to be done about his motivation until it "comes back."       Session Time: 11:00- 12:00  Participation Level: Active  Behavioral Response: CasualAlertDepressed  Type of Therapy: Group Therapy, Psychoeducation; Psychotherapy  Treatment Goals addressed: Coping  Interventions: CBT;  DBT; Solution focused; Supportive; Reframing  Summary: Clinician introduced topic of cognitive distortions and educated on what cognitive distortions are and how they affect [place]. Cln began review of the types of cognitive  distortions utilizing the handout "Cognitive Distortions." Group members determined examples of how the distortions play out and effect their lives.  Therapist Response: Pt reports understanding of cognitive distortions and reports personalization, black and white thinking, should statements, and mind reading are particularly problematic for him.       Session Time: 12:00 -1:00  Participation Level: Active  Behavioral Response: CasualAlertDepressed  Type of Therapy: Group Therapy, Psychoeducation; Psychotherapy  Treatment Goals addressed: Coping  Interventions: CBT; DBT; Solution focused; Supportive; Reframing  Summary:12:00 - 12:50:Clinician continued topic of cognitive distortions utilizing the handout "Common Unhealthy Thought Patterns." Group members determined examples of how the distortions play out and effect their lives.  12:50 -1:00 Clinician led check-out. Clinician assessed for immediate needs, medication compliance and efficacy, and safety concerns   Therapist Response: 12:00 - 12:50: Pt reports magical thinking and fallacy of fairness as problematic for him.  12:50 - 1:00: At check-out, patient rates his mood at a 5.5 on a scale of 1-10 with 10 being great. Pt states afternoon plans of playing video games. Patient demonstrates some progress as evidenced by coming to group when he did not want to. Patient denies SI/HI/self-harm at the end of group.     Suicidal/Homicidal: Nowithout intent/plan  Plan: Pt will continue in PHP while working to decrease depression symptoms and increase ability to manage symptoms in a healthy manner as they arise.   Diagnosis: Severe episode of recurrent major depressive disorder, without psychotic features ([place]) [F33.2]    1. Severe episode of recurrent major depressive disorder, without psychotic features ([place])       Lorin Glass, LCSW 08/09/2019

## 2019-08-09 NOTE — Therapy (Signed)
[facility] [place], [name], [address] Phone: [phone]   Fax:  [phone]  Occupational Therapy Treatment  Patient Details  Name: Bruce Warner MRN: [id number] Date of Birth: 14-Jul-1996 Referring Provider (OT): Ricky Ala, NP  Virtual Visit via Video Note  I connected with [name] on 08/09/19 at  8:00 AM EDT by a video enabled telemedicine application and verified that I am speaking with the correct person using two identifiers.   I discussed the limitations of evaluation and management by telemedicine and the availability of in person appointments. The patient expressed understanding and agreed to proceed.  I discussed the assessment and treatment plan with the patient. The patient was provided an opportunity to ask questions and all were answered. The patient agreed with the plan and demonstrated an understanding of the instructions.   The patient was advised to call back or seek an in-person evaluation if the symptoms worsen or if the condition fails to improve as anticipated.  I provided 60 minutes of non-face-to-face time during this encounter.   Zenovia Jarred, OT    Encounter Date: 08/06/2019  OT End of Session - 08/09/19 2001    Visit Number  2    Number of Visits  12    Date for OT Re-Evaluation  09/03/19    Authorization Type  [employer]    OT Start Time  1100    OT Stop Time  1200    OT Time Calculation (min)  60 min    Activity Tolerance  Patient tolerated treatment well    Behavior During Therapy  WFL for tasks assessed/performed       Past Medical History:  Diagnosis Date  . ADHD (attention deficit hyperactivity disorder)   . Aplastic anemia ([place]) 2005   s/p transplant 2005  . Depression   . Seasonal allergies   . SVT (supraventricular tachycardia) ([place]) 2009   s/p ablation 2009    Past Surgical History:  Procedure Laterality Date  . 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 CARDIAC ELECTROPHYSIOLOGY [place] AND ABLATION  2009   for svt    There were no vitals filed for this visit.  Subjective Assessment - 08/09/19 2000    Currently in Pain?  No/denies        S: I feel like I have good assertiveness skills   O: Education and activities given in reference to increase assertiveness skills within daily life and relationships. Further education given on assertive conversation, situations, body language, and appropriate context for skill. Pt asked to identify one area to increase assertiveness this date. Further education given on the importance of assertiveness mentors and online research to continue skill building in this area increase quality of life.   A: Pt presents with flat affect, engaged and participatory. Pt shares how he feels he has good assertiveness skills. Per report and explanation, pt needs more training in saying no consistently and effectively. Pt shares he would like to continue consistency with this skill when working. He also shares that he would like to become more consistently assertive than occasionally passive aggressive  P: OT group will be x3 per week while pt in [facility]                OT Education - 08/09/19 2001    Education Details  education given on assertiveness skills    Person(s) Educated  Patient    Methods  Explanation;Handout    Comprehension  Verbalized understanding       OT Short Term Goals - 08/09/19 2002      OT SHORT TERM GOAL #1   Title  Pt will be educated on strategies to improve psychosocial skills needed to participate fully in all daily, work, and leisure activities    Time  4    Period  Weeks    Status  On-going    Target Date  09/03/19      OT SHORT TERM GOAL #2   Title  Pt will apply psychosocial skills and coping mechanisms to daily activities in order to function independently and reintegrate into community    Time  4    Period  Weeks    Status  On-going       OT SHORT TERM GOAL #3   Title  Pt will recall and/or apply 1-3 sleep hygiene strategies to improve function in BADL routine upon reintegrating into community    Time  4    Period  Weeks    Status  On-going      OT SHORT TERM GOAL #4   Title  Pt will create and implement functional BADL/IADL routine to facilitate successful engagement in community dwelling    Time  4    Period  Weeks    Status  On-going      OT SHORT TERM GOAL #5   Title  Pt will engage in goal setting to improve functional BADL/IADL routine upon reintegrating into community.    Time  4    Period  Weeks    Status  On-going               Plan - 08/09/19 2001    Occupational performance deficits (Please refer to evaluation for details):  ADL's;IADL's;Rest and Sleep;Work;Education;Leisure;Social Participation    Body Structure / Function / Physical Skills  ADL;IADL    Cognitive Skills  Energy/Drive    Psychosocial Skills  Coping Strategies;Interpersonal Interaction;Routines and Behaviors;Habits;Environmental  Adaptations       Patient will benefit from skilled therapeutic intervention in order to improve the following deficits and impairments:   Body Structure / Function / Physical Skills: ADL, IADL Cognitive Skills: Energy/Drive Psychosocial Skills: Coping Strategies, Interpersonal Interaction, Routines and Behaviors, Habits, Environmental  Adaptations   Visit Diagnosis: Severe episode of recurrent major depressive disorder, without psychotic features (HCC)  Difficulty coping    Problem List Patient Active Problem List   Diagnosis Date Noted  . Major depressive disorder, recurrent episode, severe (HCC) 08/04/2019  . Attention deficit disorder 03/11/2016  . 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 SVT (supraventricular tachycardia) (HCC) 03/14/2015  . History of aplastic anemia 03/08/2013  . H/O allogeneic bone marrow transplant (HCC)  07/05/2004   [name], MSOT, OTR/L Behavioral Health OT/ Acute Relief OT PHP Office: [phone]  [name] 08/09/2019, 8:03 PM  [facility] PARTIAL HOSPITALIZATION PROGRAM [address] [place], [name], [address] Phone: [phone]   Fax:  [phone]  Name: ASHDON GILLSON MRN: [id number] Date of Birth: 08/29/1996

## 2019-08-10 ENCOUNTER — Other Ambulatory Visit (HOSPITAL_COMMUNITY): Payer: BC Managed Care – PPO | Admitting: Occupational Therapy

## 2019-08-10 ENCOUNTER — Other Ambulatory Visit (HOSPITAL_COMMUNITY): Payer: BC Managed Care – PPO | Admitting: Licensed Clinical Social Worker

## 2019-08-10 ENCOUNTER — Other Ambulatory Visit: Payer: Self-pay

## 2019-08-10 DIAGNOSIS — F332 Major depressive disorder, recurrent severe without psychotic features: Secondary | ICD-10-CM

## 2019-08-10 DIAGNOSIS — R4589 Other symptoms and signs involving emotional state: Secondary | ICD-10-CM

## 2019-08-10 NOTE — Psych (Signed)
Virtual Visit via Video Note  I connected with Bruce Warner on 08/10/19 at  9:00 AM EDT by a video enabled telemedicine application and verified that I am speaking with the correct person using two identifiers.   I discussed the limitations of evaluation and management by telemedicine and the availability of in person appointments. The patient expressed understanding and agreed to proceed.   I discussed the assessment and treatment plan with the patient. The patient was provided an opportunity to ask questions and all were answered. The patient agreed with the plan and demonstrated an understanding of the instructions.   The patient was advised to call back or seek an in-person evaluation if the symptoms worsen or if the condition fails to improve as anticipated.  Pt was provided 240 minutes of non-face-to-face time during this encounter.   Lorin Glass, LCSW    [facility] PHP THERAPIST PROGRESS NOTE  Bruce Warner [id number]  Session Time: 9:00 - 10:00  Participation Level: Active  Behavioral Response: CasualAlertDepressed  Type of Therapy: Group Therapy  Treatment Goals addressed: Coping  Interventions: CBT, DBT, Supportive and Reframing  Summary: Clinician led check-in regarding current stressors and situation, and review of patient completed daily inventory. Clinician utilized active listening and empathetic response and validated patient emotions. Clinician facilitated processing group on pertinent issues.   Therapist Response: Bruce Warner is a 23 y.o. male who presents with depression symptoms.  Patient arrived within time allowed and reports that he is feeling "okay." Patient rates his mood at a 6 on a scale of 1-10 with 10 being great. Pt reports he spent his afternoon and evening playing video games. Pt shares he did leave his room for dinner and went to sleep in his recent group pattern. Pt reports he did struggle with his video game at one point, in the same way he  struggled on Friday, however pushed through it yesterday and rebounded. Pt states a friend called to check in on him and he ignored the call because "it takes a lot of energy to talk to her." Pt struggles in the relationship with his father and how it affects pt. Pt able to process. Patient engaged in discussion.       Session Time: 10:00 -11:00  Participation Level: Active  Behavioral Response: CasualAlertDepressed  Type of Therapy: Group Therapy, psychoeducation, psychotherapy  Treatment Goals addressed: Coping  Interventions: CBT, DBT, Solution Focused, Supportive and Reframing  Summary: Cln led discussion on comparisons and how they affect [place]. Group members discussed the ways in which comparisons hold them back and create barriers. Cln shared thought challenging, exposure, and making adaptations as ways to manage comparisons.   Therapist Response: Pt reports comparisons are problematic with him, especially with social media. Pt reports he is not on any social media because he thinks it will affect him negatively. 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 Pt is resistant to cln's attempts to reframe these thoughts and appears to shut down during the attempt. Pt seems to have a fragile opinion of himself in terms of his work and demonstrates fluctuating low insight into that and defensive behaviors to protect it. Pt able to process.  Session Time: 11:00- 12:00  Participation Level: Active  Behavioral Response: CasualAlertDepressed  Type of Therapy: Group Therapy, Psychoeducation; Psychotherapy  Treatment Goals addressed: Coping  Interventions: CBT; Solution focused; Supportive;  Reframing  Summary: Cln introduced topic of jobs and how to healthily view work. Group viewed TED talk "Why some of [place] don't have one true calling." Group members discussed ways they did or did not relate to the talk. Cln highlighted the concept of "multipotentialite" as a way that changing perspective caused things to align in a drastic way.    Therapist Response: Patient engaged in activity. 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 Pt identifies his ideal job would include creative freedom, working with like-minded people, and art versus business being the focus.       Session Time: 12:00 -1:00  Participation Level:Active  Behavioral Response:CasualAlertDepressed  Type of Therapy: Group Therapy, OT  Treatment Goals addressed: Coping  Interventions:Psychosocial skills training, Supportive,   Summary:12:00 - 12:50:Occupational Therapy group 12:50 -1:00 Clinician led check-out. Clinician assessed for immediate needs, medication compliance and efficacy, and safety concerns   Therapist Response: 12:00 - 12:50: Patient engaged in group. See OT note.  12:50 - 1:00: At check-out, patient rates his mood at a 6 on a scale of 1-10 with 10 being great. Pt states afternoon plans of playing video games. Patient demonstrates some progress as evidenced by not sleeping all afternoon. Patient denies SI/HI/self-harm at the end of group.     Suicidal/Homicidal: Nowithout intent/plan  Plan: Pt will continue in PHP while working to decrease depression symptoms and increase ability to manage symptoms in a healthy manner as they arise.   Diagnosis: Severe episode of recurrent  major depressive disorder, without psychotic features (HCC) [F33.2]    1. Severe episode of recurrent major depressive disorder, without psychotic features (HCC)       Donia Guiles, LCSW 08/10/2019

## 2019-08-11 ENCOUNTER — Other Ambulatory Visit (HOSPITAL_COMMUNITY): Payer: BC Managed Care – PPO | Admitting: Licensed Clinical Social Worker

## 2019-08-11 ENCOUNTER — Other Ambulatory Visit: Payer: Self-pay

## 2019-08-11 DIAGNOSIS — F332 Major depressive disorder, recurrent severe without psychotic features: Secondary | ICD-10-CM

## 2019-08-11 NOTE — Psych (Signed)
Virtual Visit via Video Note  I connected with Bruce Warner on 08/11/19 at  9:00 AM EDT by a video enabled telemedicine application and verified that I am speaking with the correct person using two identifiers.   I discussed the limitations of evaluation and management by telemedicine and the availability of in person appointments. The patient expressed understanding and agreed to proceed.   I discussed the assessment and treatment plan with the patient. The patient was provided an opportunity to ask questions and all were answered. The patient agreed with the plan and demonstrated an understanding of the instructions.   The patient was advised to call back or seek an in-person evaluation if the symptoms worsen or if the condition fails to improve as anticipated.  Pt was provided 240 minutes of non-face-to-face time during this encounter.   Bruce Glass, Bruce Warner    [facility] PHP THERAPIST PROGRESS NOTE  Bruce Warner [id number]  Session Time: 9:00 - 10:00  Participation Level: Active  Behavioral Response: CasualAlertDepressed  Type of Therapy: Group Therapy  Treatment Goals addressed: Coping  Interventions: CBT, DBT, Supportive and Reframing  Summary: Clinician led check-in regarding current stressors and situation, and review of patient completed daily inventory. Clinician utilized active listening and empathetic response and validated patient emotions. Clinician facilitated processing group on pertinent issues.   Therapist Response: Bruce Warner is a 23 y.o. male who presents with depression symptoms.  Patient arrived within time allowed and reports that he is feeling "good." Patient rates his mood at a 5.5 on a scale of 1-10 with 10 being great. Pt reports he did no sleep well last night and that is affecting him today. Pt states he forgot to take his medicine yesterday until he lay down for bed so took it then. Pt states he slept 2 hours and then woke up and was unable to fall back  asleep, which he attributes to the lateness of taking his medication. Pt reports he has an alarm set to remind him of his medication and it didn't go off. Pt reset the alarm in group for today and the future. Pt states yesterday afternoon was "really really normal" and he played video games and had dinner with his aunt and uncle. 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 Pt able to process. Patient engaged in discussion.       Session Time: 10:00-11:00  Participation Level:Active  Behavioral Response:CasualAlertDepressed  Type of Therapy: Group Therapy, psychoeducation, psychotherapy  Treatment Goals addressed: Coping  Interventions:CBT, DBT, Solution Focused, Supportive and Reframing  Summary:Cln led discussion on relationships and accountability. Group members shared issues that they have or are experiencing in relationships and what the barriers they are experiencing. Cln brought in topic of boundaries, cognitive distortions, and self-esteem as options for reframing barriers.   Therapist Response: Pt shares struggles in the relationship with his dad and with an ex-girlfriend. Pt reports he was giving his dad too much control over his feelings. Pt's issue with his ex-girlfriend is he is taking too much responsibility for her feelings. Pt demonstrates low insight into the contrasting differences of the 2 situations. Pt struggles to open up on a meaningful level and responds with common therapeutic answers. Cln observed that pt was giving answers that he may think cln wants to hear and pt struggled to respond. Pt provided support  and feedback to other group members and was able to connect with them past the "right" answers after some time.        Session Time: 11:00 -12:00  Participation Level: Active  Behavioral Response:  CasualAlertDepressed  Type of Therapy: Group Therapy, psychotherapy  Treatment Goals addressed: Coping  Interventions: Strengths based, reframing, Supportive,   Summary:  Spiritual Care group  Therapist Response: Patient engaged in group. See chaplain note.        Session Time: 12:00- 1:00  Participation Level: Active  Behavioral Response: CasualAlertDepressed  Type of Therapy: Group Therapy, Psychoeducation  Treatment Goals addressed: Coping  Interventions: relaxation training; Supportive; Reframing  Summary: 12:00 - 12:50: Relaxation group: Cln led group focused on retraining the body's response to stress.   12:50 -1:00 Clinician led check-out. Clinician assessed for immediate needs, medication compliance and efficacy, and safety concerns   Therapist Response: Patient engaged in activity and discussion.  At check-out, patient rates his mood at a 5 on a scale of 1-10 with 10 being great. Pt states afternoon plans of going to an appointment, "scroll on my phone," and "try not to nap." Patient demonstrates some progress as evidenced by attempting to connect with other group members. Patient denies SI/HI/self-harm at the end of group.     Suicidal/Homicidal: Nowithout intent/plan  Plan: Pt will continue in PHP while working to decrease depression symptoms and increase ability to manage symptoms in a healthy manner as they arise.   Diagnosis: Severe episode of recurrent major depressive disorder, without psychotic features (HCC) [F33.2]    1. Severe episode of recurrent major depressive disorder, without psychotic features (HCC)       Donia Guiles, Bruce Warner 08/11/2019

## 2019-08-12 ENCOUNTER — Encounter (HOSPITAL_COMMUNITY): Payer: Self-pay

## 2019-08-12 ENCOUNTER — Other Ambulatory Visit (HOSPITAL_COMMUNITY): Payer: BC Managed Care – PPO | Admitting: Licensed Clinical Social Worker

## 2019-08-12 ENCOUNTER — Encounter (HOSPITAL_COMMUNITY): Payer: Self-pay | Admitting: Occupational Therapy

## 2019-08-12 ENCOUNTER — Other Ambulatory Visit: Payer: Self-pay

## 2019-08-12 ENCOUNTER — Other Ambulatory Visit (HOSPITAL_COMMUNITY): Payer: BC Managed Care – PPO | Admitting: Occupational Therapy

## 2019-08-12 DIAGNOSIS — F332 Major depressive disorder, recurrent severe without psychotic features: Secondary | ICD-10-CM

## 2019-08-12 DIAGNOSIS — R4589 Other symptoms and signs involving emotional state: Secondary | ICD-10-CM

## 2019-08-12 NOTE — Psych (Signed)
Virtual Visit via Video Note  I connected with Bruce Bruce on 08/12/19 at  9:00 AM EDT by a video enabled telemedicine application and verified that I am speaking with the correct person using two identifiers.   I discussed the limitations of evaluation and management by telemedicine and the availability of in person appointments. The patient expressed understanding and agreed to proceed.   I discussed the assessment and treatment plan with the patient. The patient was provided an opportunity to ask questions and all were answered. The patient agreed with the plan and demonstrated an understanding of the instructions.   The patient was advised to call back or seek an in-person evaluation if the symptoms worsen or if the condition fails to improve as anticipated.  Pt was provided 240 minutes of non-face-to-face time during this encounter.   Lorin Glass, LCSW    [facility] PHP THERAPIST PROGRESS NOTE  Bruce Bruce [id number]  Session Time: 9:00 - 10:00  Participation Level: Active  Behavioral Response: CasualAlertDepressed  Type of Therapy: Group Therapy  Treatment Goals addressed: Coping  Interventions: CBT, DBT, Supportive and Reframing  Summary: Clinician led check-in regarding current stressors and situation, and review of patient completed daily inventory. Clinician utilized active listening and empathetic response and validated patient emotions. Clinician facilitated processing group on pertinent issues.   Therapist Response: Bruce Bruce is a 23 y.o. male who presents with depression symptoms.  Patient arrived within time allowed and reports that he is feeling "okay." Patient rates his mood at a 6 on a scale of 1-10 with 10 being great. Pt reports he went to his appointment, took a nap, had dinner with his aunt and uncle, and played video games yesterday. Pt reports he did not want to come to group because he was invested in his video game, but did anyway. Pt states he did not  sleep well, getting only 3 hours and has been up since 4am. Pt struggles with perfectionism. Pt able to process. Patient engaged in discussion.      Session Time: 10:00 -11:00  Participation Level: Active  Behavioral Response: CasualAlertDepressed  Type of Therapy: Group Therapy, psychoeducation, psychotherapy  Treatment Goals addressed: Coping  Interventions: CBT, DBT, Solution Focused, Supportive and Reframing  Summary: Clinician continued topic of cognitive distortions. Cln introduced thought challenging and how to practice it by bringing logic and alternate perspectives into the scenario. Group members worked to challenge unhealthy thought pattern examples.   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 Pt identifies mind reading and emotional reasoning within his example. Pt is able to reframe and acknowledges validity in cln and group members' reframes. Pt expresses resistance to practicing thought challenging stating "I was hoping for something that didn't involve me working on my thoughts."        Session Time: 11:00- 12:00  Participation Level: Active  Behavioral Response: CasualAlertDepressed  Type of Therapy: Group Therapy, Psychoeducation; Psychotherapy  Treatment Goals addressed: Coping  Interventions: CBT; Solution focused; Supportive; Reframing  Summary: Clinician introduced topic of fear setting. Group viewed TED talk "Why you should define your fears instead of your goals."  Group utilized fear setting model to work through a current fear and analyze it.  Therapist Response: Patient engaged in activity and discussion. Pt demonstrates understanding of fear setting model as evidenced by participating in group example. Pt reports skepticism that he will use this model because "I don't  like to do active things that will help me." Pt does not elaborate and  states "I don't know, I just won't do that."       Session Time: 12:00 -1:00  Participation Level:Active  Behavioral Response:CasualAlertDepressed  Type of Therapy: Group Therapy, OT  Treatment Goals addressed: Coping  Interventions:Psychosocial skills training, Supportive,   Summary:12:00 - 12:50:Occupational Therapy group 12:50 -1:00 Clinician led check-out. Clinician assessed for immediate needs, medication compliance and efficacy, and safety concerns   Therapist Response: 12:00 - 12:50: Patient engaged in group. See OT note.  12:50 - 1:00: At check-out, patient rates his mood at a 4.5 on a scale of 1-10 with 10 being great. Pt states afternoon plans of playing video games and taking a nap. Patient demonstrates some progress as evidenced by coming to group when he did not want to. Patient denies SI/HI/self-harm at the end of group.     Suicidal/Homicidal: Nowithout intent/plan  Plan: Pt will continue in PHP while working to decrease depression symptoms and increase ability to manage symptoms in a healthy manner as they arise.   Diagnosis: Severe episode of recurrent major depressive disorder, without psychotic features (HCC) [F33.2]    1. Severe episode of recurrent major depressive disorder, without psychotic features (HCC)       Donia Guiles, LCSW 08/12/2019

## 2019-08-12 NOTE — Therapy (Signed)
[facility] PARTIAL HOSPITALIZATION PROGRAM [address] [place], [name], [address] Phone: [phone]   Fax:  [phone]  Occupational Therapy Treatment  Patient Details  Name: Bruce Warner MRN: [id number] Date of Birth: 08-30-1996 Referring Provider (OT): Hillery Jacks, NP  Virtual Visit via Video Note  I connected with Bruce Warner on 08/12/19 at  8:00 AM EDT by a video enabled telemedicine application and verified that I am speaking with the correct person using two identifiers.   I discussed the limitations of evaluation and management by telemedicine and the availability of in person appointments. The patient expressed understanding and agreed to proceed.   I discussed the assessment and treatment plan with the patient. The patient was provided an opportunity to ask questions and all were answered. The patient agreed with the plan and demonstrated an understanding of the instructions.   The patient was advised to call back or seek an in-person evaluation if the symptoms worsen or if the condition fails to improve as anticipated.  I provided 60 minutes of non-face-to-face time during this encounter.   [name], OT    Encounter Date: 08/12/2019  OT End of Session - 08/12/19 1655    Visit Number  4    Number of Visits  12    Date for OT Re-Evaluation  09/03/19    Authorization Type  BC[employer]    OT Start Time  1100    OT Stop Time  1200    OT Time Calculation (min)  60 min    Activity Tolerance  Patient tolerated treatment well    Behavior During Therapy  WFL for tasks assessed/performed       Past Medical History:  Diagnosis Date  . ADHD (attention deficit hyperactivity disorder)   . Aplastic anemia (HCC) 2005   s/p transplant 2005  . Depression   . Seasonal allergies   . SVT (supraventricular tachycardia) (HCC) 2009   s/p ablation 2009    Past Surgical History:  Procedure Laterality Date  . 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 CARDIAC ELECTROPHYSIOLOGY MAPPING AND ABLATION  2009   for svt    There were no vitals filed for this visit.  Subjective Assessment - 08/12/19 1654    Currently in Pain?  No/denies        S: My self esteem has increased since I accepted my depression    O: Education given on protective factors and their importance in building resiliency to face difficult life challenges. Protective factors worksheet completed. Pt to rate current protective factors of social support, coping skills, physical health, sense of purpose, self-esteem, and healthy thinking on a scale from weak-moderate-strong. Pt then to identify the most valuable protective factor, 2 protective factors to improve, and specific goals to accomplish this task.   A: pt presents with blunted affect, engaged and participatory. He shares how his self esteem has improved recently. He wants to improve his sense of purpose and physical health. He plans to do this by getting a plant to take care of and setting a consistent "shut off time" for video games at night.  P: OT group will be x3 per week while pt in PHP                OT Education - 08/12/19 1654    Education Details  education given on protective factors    Person(s) Educated  Patient    Methods  Explanation;Handout  Comprehension  Verbalized understanding       OT Short Term Goals - 08/09/19 2002      OT SHORT TERM GOAL #1   Title  Pt will be educated on strategies to improve psychosocial skills needed to participate fully in all daily, work, and leisure activities    Time  4    Period  Weeks    Status  On-going    Target Date  09/03/19      OT SHORT TERM GOAL #2   Title  Pt will apply psychosocial skills and coping mechanisms to daily activities in order to function independently and reintegrate into community    Time  4    Period  Weeks    Status  On-going      OT SHORT TERM GOAL #3   Title  Pt will recall and/or  apply 1-3 sleep hygiene strategies to improve function in BADL routine upon reintegrating into community    Time  4    Period  Weeks    Status  On-going      OT SHORT TERM GOAL #4   Title  Pt will create and implement functional BADL/IADL routine to facilitate successful engagement in community dwelling    Time  4    Period  Weeks    Status  On-going      OT SHORT TERM GOAL #5   Title  Pt will engage in goal setting to improve functional BADL/IADL routine upon reintegrating into community.    Time  4    Period  Weeks    Status  On-going               Plan - 08/12/19 1655    Occupational performance deficits (Please refer to evaluation for details):  ADL's;IADL's;Rest and Sleep;Work;Education;Leisure;Social Participation    Body Structure / Function / Physical Skills  ADL;IADL    Cognitive Skills  Energy/Drive    Psychosocial Skills  Coping Strategies;Interpersonal Interaction;Routines and Behaviors;Habits;Environmental  Adaptations       Patient will benefit from skilled therapeutic intervention in order to improve the following deficits and impairments:   Body Structure / Function / Physical Skills: ADL, IADL Cognitive Skills: Energy/Drive Psychosocial Skills: Coping Strategies, Interpersonal Interaction, Routines and Behaviors, Habits, Environmental  Adaptations   Visit Diagnosis: Severe episode of recurrent major depressive disorder, without psychotic features ([place])  Difficulty coping    Problem List Patient Active Problem List   Diagnosis Date Noted  . Major depressive disorder, recurrent episode, severe ([place]) 08/04/2019  . Attention deficit disorder 03/11/2016  . 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 SVT (supraventricular tachycardia) ([place]) 03/14/2015  . History of aplastic anemia 03/08/2013  . 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

## 2019-08-12 NOTE — Therapy (Signed)
[facility] [place], [name], [address] Phone: [phone]   Fax:  [phone]  Occupational Therapy Treatment  Patient Details  Name: Bruce Warner MRN: [id number] Date of Birth: 1996/10/18 Referring Provider (OT): Ricky Ala, NP  Virtual Visit via Video Note  I connected with [name] on 08/12/19 at  8:00 AM EDT by a video enabled telemedicine application and verified that I am speaking with the correct person using two identifiers.   I discussed the limitations of evaluation and management by telemedicine and the availability of in person appointments. The patient expressed understanding and agreed to proceed.  I discussed the assessment and treatment plan with the patient. The patient was provided an opportunity to ask questions and all were answered. The patient agreed with the plan and demonstrated an understanding of the instructions.   The patient was advised to call back or seek an in-person evaluation if the symptoms worsen or if the condition fails to improve as anticipated.  I provided 60 minutes of non-face-to-face time during this encounter.   Zenovia Jarred, OT    Encounter Date: 08/10/2019  OT End of Session - 08/12/19 0847    Visit Number  3    Number of Visits  12    Date for OT Re-Evaluation  09/03/19    Authorization Type  BCBS    OT Start Time  1100    OT Stop Time  1200    OT Time Calculation (min)  60 min    Activity Tolerance  Patient tolerated treatment well    Behavior During Therapy  WFL for tasks assessed/performed       Past Medical History:  Diagnosis Date  . ADHD (attention deficit hyperactivity disorder)   . Aplastic anemia ([place]) 2005   s/p transplant 2005  . Depression   . Seasonal allergies   . SVT (supraventricular tachycardia) ([place]) 2009   s/p ablation 2009    Past Surgical History:  Procedure Laterality Date  . 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 CARDIAC ELECTROPHYSIOLOGY [place] AND ABLATION  2009   for svt    There were no vitals filed for this visit.  Subjective Assessment - 08/12/19 0847    Currently in Pain?  No/denies         S: I oversleep    O: Education given on sleep hygiene and its implications with productive daily BADL routines. Pt learned sleep hygiene education via handout, asked to share current practices if applicable. At end of session pt to choose sleep hygiene strategy to begin implementing after education learned today. Discussion facilitated within group on preferred skills.  A: Pt presents with blunted affect, engaged and participatory. He shares how he often oversleeps and appears to have no sense of routine or structure. He will stay awake until exhausted and sleep however long his body allows. This past weekend he shares that he was awake for no more than 6 hours. Encouraged pt to enforce routine, but he shares that he is unsure if he is "at that point yet". Will continue to educate and enforce this for pt. He shares he will try and make his bed time more regimented, like 2-3 AM. Pt needs more routine skillls.  P: OT group will be x3 per week while pt in [facility]             OT Education - 08/12/19 0847    Education  Details  education given on sleep hygiene    Person(s) Educated  Patient    Methods  Explanation;Handout    Comprehension  Verbalized understanding       OT Short Term Goals - 08/09/19 2002      OT SHORT TERM GOAL #1   Title  Pt will be educated on strategies to improve psychosocial skills needed to participate fully in all daily, work, and leisure activities    Time  4    Period  Weeks    Status  On-going    Target Date  09/03/19      OT SHORT TERM GOAL #2   Title  Pt will apply psychosocial skills and coping mechanisms to daily activities in order to function independently and reintegrate into community    Time  4    Period  Weeks    Status   On-going      OT SHORT TERM GOAL #3   Title  Pt will recall and/or apply 1-3 sleep hygiene strategies to improve function in BADL routine upon reintegrating into community    Time  4    Period  Weeks    Status  On-going      OT SHORT TERM GOAL #4   Title  Pt will create and implement functional BADL/IADL routine to facilitate successful engagement in community dwelling    Time  4    Period  Weeks    Status  On-going      OT SHORT TERM GOAL #5   Title  Pt will engage in goal setting to improve functional BADL/IADL routine upon reintegrating into community.    Time  4    Period  Weeks    Status  On-going               Plan - 08/12/19 0847    Occupational performance deficits (Please refer to evaluation for details):  ADL's;IADL's;Rest and Sleep;Work;Education;Leisure;Social Participation    Body Structure / Function / Physical Skills  ADL;IADL    Cognitive Skills  Energy/Drive    Psychosocial Skills  Coping Strategies;Interpersonal Interaction;Routines and Behaviors;Habits;Environmental  Adaptations       Patient will benefit from skilled therapeutic intervention in order to improve the following deficits and impairments:   Body Structure / Function / Physical Skills: ADL, IADL Cognitive Skills: Energy/Drive Psychosocial Skills: Coping Strategies, Interpersonal Interaction, Routines and Behaviors, Habits, Environmental  Adaptations   Visit Diagnosis: Severe episode of recurrent major depressive disorder, without psychotic features (HCC)  Difficulty coping    Problem List Patient Active Problem List   Diagnosis Date Noted  . Major depressive disorder, recurrent episode, severe (HCC) 08/04/2019  . Attention deficit disorder 03/11/2016  . 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 SVT (supraventricular tachycardia) (HCC) 03/14/2015  . History of aplastic anemia 03/08/2013  . H/O allogeneic bone marrow  transplant (HCC) 07/05/2004   [name], MSOT, OTR/L Behavioral Health OT/ Acute Relief OT PHP Office: [phone]  [name] 08/12/2019, 8:48 AM  [facility] HOSPITALIZATION PROGRAM [address] [place], [name], [address] Phone: [phone]   Fax:  [phone]  Name: KILIAN SCHWARTZ MRN: [id number] Date of Birth: June 13, 1996

## 2019-08-13 ENCOUNTER — Other Ambulatory Visit: Payer: Self-pay

## 2019-08-13 ENCOUNTER — Encounter (HOSPITAL_COMMUNITY): Payer: Self-pay

## 2019-08-13 ENCOUNTER — Other Ambulatory Visit (HOSPITAL_COMMUNITY): Payer: BC Managed Care – PPO | Admitting: Licensed Clinical Social Worker

## 2019-08-13 ENCOUNTER — Other Ambulatory Visit (HOSPITAL_COMMUNITY): Payer: BC Managed Care – PPO | Admitting: Occupational Therapy

## 2019-08-13 DIAGNOSIS — F332 Major depressive disorder, recurrent severe without psychotic features: Secondary | ICD-10-CM

## 2019-08-13 DIAGNOSIS — R4589 Other symptoms and signs involving emotional state: Secondary | ICD-10-CM

## 2019-08-13 NOTE — Progress Notes (Signed)
Virtual Visit via Video Note  I connected with [name] on 08/13/19 at  9:00 AM EDT by a video enabled telemedicine application and verified that I am speaking with the correct person using two identifiers.   I discussed the limitations of evaluation and management by telemedicine and the availability of in person appointments. The patient expressed understanding and agreed to proceed.    I discussed the assessment and treatment plan with the patient. The patient was provided an opportunity to ask questions and all were answered. The patient agreed with the plan and demonstrated an understanding of the instructions.   The patient was advised to call back or seek an in-person evaluation if the symptoms worsen or if the condition fails to improve as anticipated.  I provided 00 minutes of non-face-to-face time during this encounter.   Derrill Center, NP   BH MD/PA/NP OP Progress Note  08/13/2019 9:31 AM JAAZIAH SCHULKE  MRN:  [id number]   Evaluation:  Kwadwo evaluate via webex. continues to deny suicidal or homicidal ideations. Reported he was unable to take nap yesterday, after taken is medications. Reported taken Vyvances and  Trixillix 20 mg at 1 pm  And stated he wasn't able to rest with increased heart rate. Education was provided with taken medications earlier.  Zakye continues to report social isolation and depression however mood appears is incongruent.  Denies auditory or visual hallucinations.  Patient reports interrupted sleep and restless nights and days due to videogame playing.  Patient to continue partial hospitalization programming.  Support, encouragement  And reassurance was provided.  Visit Diagnosis: No diagnosis found.  Past Psychiatric History:   Past Medical History:  Past Medical History:  Diagnosis Date  . ADHD (attention deficit hyperactivity disorder)   . Aplastic anemia ([place]) 2005   s/p transplant 2005  . Depression   . Seasonal allergies   . SVT  (supraventricular tachycardia) ([place]) 2009   s/p ablation 2009    Past Surgical History:  Procedure Laterality Date  . 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 CARDIAC ELECTROPHYSIOLOGY MAPPING AND ABLATION  2009   for svt    Family Psychiatric History:   Family History:  Family History  Problem Relation Age of Onset  . Hypertension Maternal Grandfather   . Heart disease Paternal Grandfather   . Anxiety disorder Mother   . ADD / ADHD Sister   . Depression Sister   . Anxiety disorder Sister   . Alcohol abuse Sister   . ADD / ADHD Brother   . AAA (abdominal aortic aneurysm) Brother   . Hyperlipidemia Maternal Grandmother   . Hypertension Maternal Grandmother   . Stroke Maternal Grandmother     Social History:  Social History   Socioeconomic History  . Marital status: Single    Spouse name: Not on file  . Number of children: 0  . Years of education: Not on file  . Highest education level: Some college, no degree  Occupational History    Comment: [employer]  Social Needs  . Financial resource strain: Not hard at all  . Food insecurity    Worry: Never true    Inability: Never true  . Transportation needs    Medical: Yes    Non-medical: Yes  Tobacco Use  . Smoking status: Current Every Day Smoker    Types: Cigarettes  . Smokeless tobacco: Never Used  . Tobacco comment: 1/2 pack per day  Substance and Sexual Activity  .  Alcohol use: Yes    Alcohol/week: 2.0 standard drinks    Types: 2 Standard drinks or equivalent per week    Comment: weekly - binge drinking - 10 beers at a time  . Drug use: No    Comment: Marijuana - past use   . Sexual activity: Never  Lifestyle  . Physical activity    Days per week: 0 days    Minutes per session: 0 min  . Stress: Not at all  Relationships  . Social Musicianconnections    Talks on phone: Three times a week    Gets together: Never    Attends religious service: Never    Active member of club or  organization: No    Attends meetings of clubs or organizations: Never    Relationship status: Never married  Other Topics Concern  . Not on file  Social History Narrative   Lives with sister    Allergies: No Known Allergies  Metabolic Disorder Labs: No results found for: HGBA1C, MPG No results found for: PROLACTIN Lab Results  Component Value Date   CHOL 114 05/25/2012   TRIG 52 05/25/2012   HDL 56 05/25/2012   CHOLHDL 2.0 05/25/2012   VLDL 10 05/25/2012   LDLCALC 48 05/25/2012   Lab Results  Component Value Date   TSH 1.758 06/26/2015   TSH 2.116 05/26/2013    Therapeutic Level Labs: No results found for: LITHIUM No results found for: VALPROATE No components found for:  CBMZ  Current Medications: Current Outpatient Medications  Medication Sig Dispense Refill  . carbamide peroxide (DEBROX) 6.5 % otic solution Place 5 drops into both ears 2 (two) times daily. (Patient not taking: Reported on 08/06/2019) 15 mL 1  . hydrOXYzine (VISTARIL) 25 MG capsule Take 1 capsule (25 mg total) by mouth at bedtime as needed. 30 capsule 0  . ibuprofen (ADVIL,MOTRIN) 200 MG tablet Take 200 mg by mouth every 6 (six) hours as needed.    Marland Kitchen. lisdexamfetamine (VYVANSE) 30 MG capsule Take 1 capsule (30 mg total) by mouth daily. Do not fill before 12/08/16-01/08/17 30 capsule 0  . lisdexamfetamine (VYVANSE) 30 MG capsule Take 1 capsule (30 mg total) by mouth daily. Fill dates 01/08/17-02/03/17 30 capsule 0  . lisdexamfetamine (VYVANSE) 30 MG capsule Take 1 capsule (30 mg total) by mouth daily. 02/03/17-03/05/17 Fill dates 30 capsule 0  . Oxcarbazepine (TRILEPTAL) 300 MG tablet Take 300 mg by mouth 2 (two) times daily.    . traZODone (DESYREL) 50 MG tablet Take 0.5-1 tablets (25-50 mg total) by mouth at bedtime as needed for sleep. (Patient not taking: Reported on 08/06/2019) 30 tablet 3  . vortioxetine HBr (TRINTELLIX) 20 MG TABS tablet Take 1 tablet (20 mg total) by mouth daily. 30 tablet 0   No current  facility-administered medications for this visit.      Musculoskeletal: Strength & Muscle Tone: within normal limits Gait & Station: normal Patient leans: N/A  Psychiatric Specialty Exam: ROS  There were no vitals taken for this visit.There is no height or weight on file to calculate BMI.  General Appearance: Casual  Eye Contact:  Fair  Speech:  Clear and Coherent  Volume:  Normal  Mood:  Anxious and Depressed  Affect:  Congruent  Thought Process:  Coherent  Orientation:  Full (Time, Place, and Person)  Thought Content: Logical   Suicidal Thoughts:  No  Homicidal Thoughts:  No  Memory:  Immediate;   Fair Recent;   Fair  Judgement:  Fair  Insight:  Fair  Psychomotor Activity:  Normal  Concentration:  Concentration: Fair  Recall:  [employer] of Knowledge: Fair  Language: Fair  Akathisia:  No  Handed:  Right  AIMS (if indicated):   Assets:  Communication Skills Desire for Improvement Resilience Social Support  ADL's:  Intact  Cognition: WNL  Sleep:  Fair   Screenings: PHQ2-9     Counselor from 08/06/2019 in BEHAVIORAL HEALTH PARTIAL HOSPITALIZATION PROGRAM Office Visit from 11/25/2016 in Primary Care at [facility] Visit from 11/07/2016 in Primary Care at [facility] Visit from 10/03/2016 in Primary Care at [facility] Visit from 08/16/2016 in Primary Care at [facility] Total Score  6  0  1  0  0  PHQ-9 Total Score  20  -  -  -  -       Assessment and Plan:  Continue Partial Hospitalization -Continue medication as directed education was provided with taken medications earlier will continue to monitor symptoms    Treatment plan was reviewed and agreed upon by NPT Kamoni Depree and patient Jermie Hippe  need for continued group services  Oneta Rack, NP 08/13/2019, 9:31 AM

## 2019-08-13 NOTE — Progress Notes (Signed)
Spoke with patient via Webex video call, used 2 identifiers to correctly identify patient. States he is having a good day. Slept good last night but not sure if it was because he was exhausted from not sleeping or if his vistaril worked. His Trintellix was increased to 20mg . Denies SI/HI or AV hallucinations. Still needs an initial appointment with Dr Creig Hines so he will have a psychiatrist when he leaves the program. On scale of 1-10 as 10 being worst he rates depression at 2 and anxiety at 1. States that groups are going well and he has actually taken down a few notes even though he never takes notes. Pleasant, smiling, denies any side effects, no issues or complaints.

## 2019-08-13 NOTE — Psych (Addendum)
Virtual Visit via Video Note  I connected with Bruce Warner on 08/13/19 at  9:00 AM EDT by a video enabled telemedicine application and verified that I am speaking with the correct person using two identifiers.   I discussed the limitations of evaluation and management by telemedicine and the availability of in person appointments. The patient expressed understanding and agreed to proceed.   I discussed the assessment and treatment plan with the patient. The patient was provided an opportunity to ask questions and all were answered. The patient agreed with the plan and demonstrated an understanding of the instructions.   The patient was advised to call back or seek an in-person evaluation if the symptoms worsen or if the condition fails to improve as anticipated.  Pt was provided 240 minutes of non-face-to-face time during this encounter.   Donia Guiles, LCSW    [facility] PHP THERAPIST PROGRESS NOTE  Bruce Warner [id number]  Session Time: 9:00 - 10:00  Participation Level: Active  Behavioral Response: CasualAlertDepressed  Type of Therapy: Group Therapy  Treatment Goals addressed: Coping  Interventions: CBT, DBT, Supportive and Reframing  Summary: Clinician led check-in regarding current stressors and situation, and review of patient completed daily inventory. Clinician utilized active listening and empathetic response and validated patient emotions. Clinician facilitated processing group on pertinent issues.   Therapist Response: Bruce Warner is a 23 y.o. male who presents with depression symptoms.  Patient arrived within time allowed and reports that he is feeling "okay." Patient rates his mood at a 6 on a scale of 1-10 with 10 being great. Pt reports he just woke up and would have "slept all day" if he was able. Pt reports pushing himself to get up and not give in to sleeping. Pt shares he went to bed early and slept almost 9 hours. Pt shares he thinks he is tired due to not  sleeping much this week. Pt reports he tried to nap yesterday but was unable to fall asleep and he played video games to fill his time. 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 Pt presents this information in a blase manner and states he feels "nothing" about that step. Pt struggles with acting against his feelings. Pt able to process. Patient engaged in discussion.      Session Time: 10:00 -11:00  Participation Level: Active  Behavioral Response: CasualAlertDepressed  Type of Therapy: Group Therapy, psychoeducation, psychotherapy  Treatment Goals addressed: Coping  Interventions: CBT, DBT, Solution Focused, Supportive and Reframing  Summary: Clinician led discussion on how to determine the deeper issue behind a feeling. Cln introduced the concept of "place holder" feelings/events and how to work it back to determine the bigger feeling/issue at hand. Group members worked with examples to practice the concept and shared ways in which they could gain insight by using this practice.   Therapist Response: Pt participates in discussion. Pt is able to work back his lack of motivation stemming from an anxiety/fear of not being "good enough" to work in his desired field. Pt demonstrates resistance and argues throughout the exercise, however states recognizing "there might be something in that." Pt seems to deflect or argue when feeling vulnerable.        Session Time: 11:00- 12:00  Participation Level: Active  Behavioral Response: CasualAlertDepressed  Type of Therapy: Group Therapy, Psychoeducation; Psychotherapy  Treatment Goals addressed: Coping  Interventions: CBT; Solution focused; Supportive; Reframing  Summary: Clinician introduced topic  of needs. Cln provided education on [place] of Needs and discussed how to apply it in the way we think about what necessitates a need and what order is  logical for tackling issues.   Therapist Response: Patient engaged in discussion. Pt reports he "sees the point" however does not believe he can fulfill lower areas of the pyramid before he is "self-actualized." Pt is resistant to reframing.        Session Time: 12:00 -1:00  Participation Level:Active  Behavioral Response:CasualAlertDepressed  Type of Therapy: Group Therapy, OT  Treatment Goals addressed: Coping  Interventions:Psychosocial skills training, Supportive,   Summary:12:00 - 12:50:Occupational Therapy group 12:50 -1:00 Clinician led check-out. Clinician assessed for immediate needs, medication compliance and efficacy, and safety concerns   Therapist Response: 12:00 - 12:50: Patient engaged in group. See OT note.  12:50 - 1:00: At check-out, patient rates his mood at a 8 on a scale of 1-10 with 10 being great. Pt states afternoon plans of playing video games and [place] with a friend. Patient demonstrates limited progress as evidenced by resistance to applying new ideas. Patient denies SI/HI/self-harm at the end of group.     Suicidal/Homicidal: Nowithout intent/plan  Plan: Pt will continue in PHP while working to decrease depression symptoms and increase ability to manage symptoms in a healthy manner as they arise.   Diagnosis: Severe episode of recurrent major depressive disorder, without psychotic features ([place]) [F33.2]    1. Severe episode of recurrent major depressive disorder, without psychotic features ([place])       Lorin Glass, LCSW 08/13/2019

## 2019-08-16 ENCOUNTER — Other Ambulatory Visit: Payer: Self-pay

## 2019-08-16 ENCOUNTER — Other Ambulatory Visit (HOSPITAL_COMMUNITY): Payer: BC Managed Care – PPO | Admitting: Licensed Clinical Social Worker

## 2019-08-16 DIAGNOSIS — F332 Major depressive disorder, recurrent severe without psychotic features: Secondary | ICD-10-CM

## 2019-08-17 ENCOUNTER — Other Ambulatory Visit (HOSPITAL_COMMUNITY): Payer: BC Managed Care – PPO | Admitting: Occupational Therapy

## 2019-08-17 ENCOUNTER — Encounter (HOSPITAL_COMMUNITY): Payer: Self-pay | Admitting: Occupational Therapy

## 2019-08-17 ENCOUNTER — Other Ambulatory Visit: Payer: Self-pay

## 2019-08-17 ENCOUNTER — Other Ambulatory Visit (HOSPITAL_COMMUNITY): Payer: BC Managed Care – PPO | Admitting: Licensed Clinical Social Worker

## 2019-08-17 DIAGNOSIS — R4589 Other symptoms and signs involving emotional state: Secondary | ICD-10-CM

## 2019-08-17 DIAGNOSIS — F332 Major depressive disorder, recurrent severe without psychotic features: Secondary | ICD-10-CM

## 2019-08-17 NOTE — Psych (Signed)
Virtual Visit via Video Note  I connected with Bruce Picket on 08/16/19 at  9:00 AM EDT by a video enabled telemedicine application and verified that I am speaking with the correct person using two identifiers.   I discussed the limitations of evaluation and management by telemedicine and the availability of in person appointments. The patient expressed understanding and agreed to proceed.   I discussed the assessment and treatment plan with the patient. The patient was provided an opportunity to ask questions and all were answered. The patient agreed with the plan and demonstrated an understanding of the instructions.   The patient was advised to call back or seek an in-person evaluation if the symptoms worsen or if the condition fails to improve as anticipated.  Pt was provided 240 minutes of non-face-to-face time during this encounter.   Bruce Glass, LCSW    [facility] PHP THERAPIST PROGRESS NOTE  Bruce Warner [id number]  Session Time: 9:00 - 10:00  Participation Level: Active  Behavioral Response: CasualAlertDepressed  Type of Therapy: Group Therapy  Treatment Goals addressed: Coping  Interventions: CBT, DBT, Supportive and Reframing  Summary: Clinician led check-in regarding current stressors and situation, and review of patient completed daily inventory. Clinician utilized active listening and empathetic response and validated patient emotions. Clinician facilitated processing group on pertinent issues.   Therapist Response: Bruce Warner is a 23 y.o. male who presents with depression symptoms.  Patient arrived within time allowed and reports that he is feeling "tired." Patient rates his mood at a 6 on a scale of 1-10 with 10 being great. Pt reports he "barely wanted to get on" this morning because he didn't want to wake up. Pt states he spent his weekend playing video games and sleeping. Pt states he slept more than he normally does, but not as much as last weekend. 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 Pt continues to present as indifferent about taking the step to post and on the feedback. Pt able to process. Patient engaged in discussion.      Session Time: 10:00 -11:00  Participation Level: Active  Behavioral Response: CasualAlertDepressed  Type of Therapy: Group Therapy, psychoeducation, psychotherapy  Treatment Goals addressed: Coping  Interventions: CBT, DBT, Solution Focused, Supportive and Reframing  Summary: Cln led discussion on how pt's are doing in the current social climate. Pt's shared ways they are affected and or dealing with the pandemic and social justice issues currently happening in our society.   Therapist Response: Patient states "I know it's bad, but it doesn't really affect me" regarding the state of society. Pt recognizes that the pandemic restrictions have limited his socialization options, job opportunities, and possibly given him an out to "trying harder." Pt able to process.        Session Time: 11:00- 12:00  Participation Level: Active  Behavioral Response: CasualAlertDepressed  Type of Therapy: Group Therapy, Psychoeducation; Psychotherapy  Treatment Goals addressed: Coping  Interventions: CBT; DBT; Solution focused; Supportive; Reframing  Summary: Clinician continued topic of needs and introduced the Needs Assessment. Cln reviewed pt's answers in the needs assessment and problem solved options to address needs that need to be met.   Therapist Response: Pt participated in discussion and activity. Pt identifies self-esteem, career, and hobbies as  areas he needs to work on.       Session Time: 12:00 -1:00  Participation Level: Active  Behavioral Response:  CasualAlertDepressed  Type of Therapy: Group Therapy, Psychoeducation; Psychotherapy  Treatment Goals addressed: Coping  Interventions: CBT; DBT; Solution focused; Supportive; Reframing  Summary:12:00 - 12:50:Clinician introduced topic of radical acceptance. Cln educated on the concept of radical acceptance and times in which it may be helpful.  12:50 -1:00 Clinician led check-out. Clinician assessed for immediate needs, medication compliance and efficacy, and safety concerns   Therapist Response: 12:00 - 12:50: Pt reports understanding of radical acceptance and identifies that acceptance would be helpful in terms of his current situation.  12:50 - 1:00: At check-out, patient rates his mood at a 6 on a scale of 1-10 with 10 being great. Pt states afternoon plans of playing video games. Patient demonstrates some progress as evidenced by staying committed to treatment. Patient denies SI/HI/self-harm at the end of group.     Suicidal/Homicidal: Nowithout intent/plan  Plan: Pt will continue in PHP while working to decrease depression symptoms and increase ability to manage symptoms in a healthy manner as they arise.   Diagnosis: Severe episode of recurrent major depressive disorder, without psychotic features ([place]) [F33.2]    1. Severe episode of recurrent major depressive disorder, without psychotic features ([place])       Bruce Glass, LCSW 08/17/2019

## 2019-08-17 NOTE — Therapy (Signed)
[facility] PARTIAL HOSPITALIZATION PROGRAM [address] [place], [name], [address] Phone: [phone]   Fax:  [phone]  Occupational Therapy Treatment  Patient Details  Name: Bruce Warner MRN: [id number] Date of Birth: April 18, 1996 Referring Provider (OT): Hillery Jacks, NP  Virtual Visit via Video Note  I connected with Bruce Warner on 08/17/19 at  8:00 AM EDT by a video enabled telemedicine application and verified that I am speaking with the correct person using two identifiers.   I discussed the limitations of evaluation and management by telemedicine and the availability of in person appointments. The patient expressed understanding and agreed to proceed.   I discussed the assessment and treatment plan with the patient. The patient was provided an opportunity to ask questions and all were answered. The patient agreed with the plan and demonstrated an understanding of the instructions.   The patient was advised to call back or seek an in-person evaluation if the symptoms worsen or if the condition fails to improve as anticipated.  I provided 60 minutes of non-face-to-face time during this encounter.   [name], OT    Encounter Date: 08/13/2019  OT End of Session - 08/17/19 1629    Visit Number  5    Number of Visits  12    Date for OT Re-Evaluation  09/03/19    Authorization Type  [employer]    OT Start Time  1100    OT Stop Time  1200    OT Time Calculation (min)  60 min    Activity Tolerance  Patient tolerated treatment well    Behavior During Therapy  WFL for tasks assessed/performed       Past Medical History:  Diagnosis Date  . ADHD (attention deficit hyperactivity disorder)   . Aplastic anemia (HCC) 2005   s/p transplant 2005  . Depression   . Seasonal allergies   . SVT (supraventricular tachycardia) (HCC) 2009   s/p ablation 2009    Past Surgical History:  Procedure Laterality Date  . BONE MARROW TRANSPLANT  2005    trnasplant from his brother  . CARDIAC ELECTROPHYSIOLOGY MAPPING AND ABLATION  2009   for svt    There were no vitals filed for this visit.  Subjective Assessment - 08/17/19 1629    Currently in Pain?  No/denies       S: This applies to how I communicate to my siblings   O: Education given on verbal communication skills this date. Fair fighting rules discussed in reference to a variety of relationships. Pt asked to apply certain rules to a current situation in life. Video clip of a relationship argument shown for pts to apply fair fighting rules and discuss with group."I statements" worksheet given to provide education on appropriate communication styles when in emotional situations. Pt asked to apply personal example of I statement at end of session.   A: Pt presents with blunted affect, engaged and participatory. Pt shares how this applies to when he argues with his siblings. He also applies skills to previous romantic relationship. He shares that he would like to improve how often he "escapes" or "shuts down" to better improve communication methods.  P: OT group will be x3 per week while pt in PHP                  OT Education - 08/17/19 1629    Education Details  education given on communication skills    Person(s) Educated  Patient  Methods  Explanation;Handout    Comprehension  Verbalized understanding       OT Short Term Goals - 08/09/19 2002      OT SHORT TERM GOAL #1   Title  Pt will be educated on strategies to improve psychosocial skills needed to participate fully in all daily, work, and leisure activities    Time  4    Period  Weeks    Status  On-going    Target Date  09/03/19      OT SHORT TERM GOAL #2   Title  Pt will apply psychosocial skills and coping mechanisms to daily activities in order to function independently and reintegrate into community    Time  4    Period  Weeks    Status  On-going      OT SHORT TERM GOAL #3   Title  Pt will  recall and/or apply 1-3 sleep hygiene strategies to improve function in BADL routine upon reintegrating into community    Time  4    Period  Weeks    Status  On-going      OT SHORT TERM GOAL #4   Title  Pt will create and implement functional BADL/IADL routine to facilitate successful engagement in community dwelling    Time  4    Period  Weeks    Status  On-going      OT SHORT TERM GOAL #5   Title  Pt will engage in goal setting to improve functional BADL/IADL routine upon reintegrating into community.    Time  4    Period  Weeks    Status  On-going               Plan - 08/17/19 1629    Occupational performance deficits (Please refer to evaluation for details):  ADL's;IADL's;Rest and Sleep;Work;Education;Leisure;Social Participation    Body Structure / Function / Physical Skills  ADL;IADL    Cognitive Skills  Energy/Drive    Psychosocial Skills  Coping Strategies;Interpersonal Interaction;Routines and Behaviors;Habits;Environmental  Adaptations       Patient will benefit from skilled therapeutic intervention in order to improve the following deficits and impairments:   Body Structure / Function / Physical Skills: ADL, IADL Cognitive Skills: Energy/Drive Psychosocial Skills: Coping Strategies, Interpersonal Interaction, Routines and Behaviors, Habits, Environmental  Adaptations   Visit Diagnosis: Severe episode of recurrent major depressive disorder, without psychotic features ([place])  Difficulty coping    Problem List Patient Active Problem List   Diagnosis Date Noted  . Major depressive disorder, recurrent episode, severe ([place]) 08/04/2019  . Attention deficit disorder 03/11/2016  . 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 SVT (supraventricular tachycardia) ([place]) 03/14/2015  . History of aplastic anemia 03/08/2013  . 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

## 2019-08-18 ENCOUNTER — Encounter (HOSPITAL_COMMUNITY): Payer: Self-pay

## 2019-08-18 ENCOUNTER — Other Ambulatory Visit (HOSPITAL_COMMUNITY): Payer: BC Managed Care – PPO | Admitting: Licensed Clinical Social Worker

## 2019-08-18 ENCOUNTER — Other Ambulatory Visit: Payer: Self-pay

## 2019-08-18 DIAGNOSIS — F332 Major depressive disorder, recurrent severe without psychotic features: Secondary | ICD-10-CM

## 2019-08-18 NOTE — Progress Notes (Signed)
Spoke with patient via Webex video call, used 2 identifiers to correctly identify patient. States that group is going well, he hopes to start IOP next week. He is still in need of an appointment with Dr Creig Hines and he is worried that he won't get one before he is discharged from the program. He talked about feeling somewhat like a failure since he has no job and lives with his aunt and uncle. He is able to recognize that he is making positive changes and improving his life by taking charge of his mental health. He was having some heart palpitations when his Trintellix was increased last week but it only happened the first 2 days and no longer has issues with it. On a scale 1-10 as 10 being worst he rates depression at 4 and anxiety at 1. Denies SI/HI or AV hallucinations. PHQ9 was 7 today. No issues or complaints.

## 2019-08-18 NOTE — Psych (Signed)
Virtual Visit via Video Note  I connected with Bruce Warner on 08/18/19 at  9:00 AM EDT by a video enabled telemedicine application and verified that I am speaking with the correct person using two identifiers.   I discussed the limitations of evaluation and management by telemedicine and the availability of in person appointments. The patient expressed understanding and agreed to proceed.   I discussed the assessment and treatment plan with the patient. The patient was provided an opportunity to ask questions and all were answered. The patient agreed with the plan and demonstrated an understanding of the instructions.   The patient was advised to call back or seek an in-person evaluation if the symptoms worsen or if the condition fails to improve as anticipated.  Pt was provided 240 minutes of non-face-to-face time during this encounter.   Donia Guiles, LCSW    [facility] PHP THERAPIST PROGRESS NOTE  Bruce Warner [id number]  Session Time: 9:00 - 10:00  Participation Level: Active  Behavioral Response: CasualAlertDepressed  Type of Therapy: Group Therapy  Treatment Goals addressed: Coping  Interventions: CBT, DBT, Supportive and Reframing  Summary: Clinician led check-in regarding current stressors and situation, and review of patient completed daily inventory. Clinician utilized active listening and empathetic response and validated patient emotions. Clinician facilitated processing group on pertinent issues.   Therapist Response: Bruce Warner is a 23 y.o. male who presents with depression symptoms.  Patient arrived within time allowed and reports that he is feeling "good." Patient rates his mood at a 6 on a scale of 1-10 with 10 being great. Pt reports he just received a video from an old friend saying she missed him and that made him feel happy. Pt shares yesterday he played video games, however did so with an old friend which added an extra dimension. Pt continues to struggle  with mood dependent thinking. Patient engaged in discussion.       Session Time: 10:00-11:00  Participation Level:Active  Behavioral Response:CasualAlertDepressed  Type of Therapy: Group Therapy, psychoeducation, psychotherapy  Treatment Goals addressed: Coping  Interventions:CBT, DBT, Solution Focused, Supportive and Reframing  Summary:Cln led discussion on grief. Group discussed the ways in which grief is currently presenting for them and how to move through it.   Therapist Response: Pt identifies grieving the way he expected his life to be right now. Pt reports wanting to be in [place] and working in his field and not struggling with depression. Pt states acceptance of the fact that this is not where he is is a struggle for him and is able to connect this loss to grief.        Session Time: 11:00 -12:00  Participation Level: Active  Behavioral Response: CasualAlertDepressed  Type of Therapy: Group Therapy, psychotherapy  Treatment Goals addressed: Coping  Interventions: Strengths based, reframing, Supportive,   Summary:  Spiritual Care group  Therapist Response: Patient engaged in group. See chaplain note.        Session Time: 12:00- 1:00  Participation Level: Active  Behavioral Response: CasualAlertDepressed  Type of Therapy: Group Therapy, Psychoeducation, Psychotherapy  Treatment Goals addressed: Coping  Interventions: CBT, DBT, Solution Focused, Supportive and Reframing  Summary: 12:00 - 12:50: Cln continued topic of vulnerability. Group members discussed areas in which they feel vulnerable and how it is affecting them.  12:50 -1:00 Clinician led check-out. Clinician assessed for immediate needs, medication compliance and efficacy, and safety concerns   Therapist Response: Patient engaged in discussion. Pt identifies feeling most vulnerable about  his career and self esteem. 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 Pt able to process.    At [place], patient rates his mood at a 6.5 on a scale of 1-10 with 10 being great. Pt states afternoon plans of going to an appointment, playing video games and talking to his uncle. Patient demonstrates some progress as evidenced by reaching out to old friends. Patient denies SI/HI/self-harm at the end of group.     Suicidal/Homicidal: Nowithout intent/plan  Plan: Pt will continue in PHP while working to decrease depression symptoms and increase ability to manage symptoms in a healthy manner as they arise.   Diagnosis: Severe episode of recurrent major depressive disorder, without psychotic features ([place]) [F33.2]    1. Severe episode of recurrent major depressive disorder, without psychotic features ([place])       Lorin Glass, LCSW 08/18/2019

## 2019-08-18 NOTE — Therapy (Signed)
[facility] PARTIAL HOSPITALIZATION PROGRAM [address] [place], [name], [address] Phone: [phone]   Fax:  [phone]  Occupational Therapy Treatment  Patient Details  Name: Bruce Warner MRN: [id number] Date of Birth: January 31, 1996 Referring Provider (OT): Hillery Jacks, NP  Virtual Visit via Video Note  I connected with Bruce Warner on 08/18/19 at  8:00 AM EDT by a video enabled telemedicine application and verified that I am speaking with the correct person using two identifiers.   I discussed the limitations of evaluation and management by telemedicine and the availability of in person appointments. The patient expressed understanding and agreed to proceed.  I discussed the assessment and treatment plan with the patient. The patient was provided an opportunity to ask questions and all were answered. The patient agreed with the plan and demonstrated an understanding of the instructions.   The patient was advised to call back or seek an in-person evaluation if the symptoms worsen or if the condition fails to improve as anticipated.  I provided 60 minutes of non-face-to-face time during this encounter.   [name], OT    Encounter Date: 08/17/2019  OT End of Session - 08/18/19 1148    Visit Number  6    Number of Visits  12    Date for OT Re-Evaluation  09/03/19    Authorization Type  BCBS    OT Start Time  1100    OT Stop Time  1200    OT Time Calculation (min)  60 min    Activity Tolerance  Patient tolerated treatment well    Behavior During Therapy  WFL for tasks assessed/performed       Past Medical History:  Diagnosis Date  . ADHD (attention deficit hyperactivity disorder)   . Aplastic anemia (HCC) 2005   s/p transplant 2005  . Depression   . Seasonal allergies   . SVT (supraventricular tachycardia) (HCC) 2009   s/p ablation 2009    Past Surgical History:  Procedure Laterality Date  . 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 CARDIAC ELECTROPHYSIOLOGY MAPPING AND ABLATION  2009   for svt    There were no vitals filed for this visit.  Subjective Assessment - 08/18/19 1148    Currently in Pain?  No/denies          S: My self esteem is low to moderate  O: Education given on self esteem and how it relates to daily experiences. Pt asked to give definition of self esteem, and current rating of self esteem. Positive and negative contributors of self esteem to be brainstormed within group in relation to personal experiences. Positive thinking activity then completed for pt to identify several positive traits about themselves. Pt asked to share at end of session.   A: Pt presents with blunted affect, engaged and participatory throughout session. Pt shares that his self esteem is moderate to low. He is feeling a decreased sense of purpose. He shares that engaging in Primary school teacher and feeling needed helps to increase his self esteem. Pt to complete positive thinking activity after group.  P: OT group will be x3 per week while pt in PHP                 OT Education - 08/18/19 1148    Education Details  education given on self esteem    Person(s) Educated  Patient    Methods  Explanation;Handout    Comprehension  Verbalized understanding       OT Short Term Goals - 08/09/19 2002      OT SHORT TERM GOAL #1   Title  Pt will be educated on strategies to improve psychosocial skills needed to participate fully in all daily, work, and leisure activities    Time  4    Period  Weeks    Status  On-going    Target Date  09/03/19      OT SHORT TERM GOAL #2   Title  Pt will apply psychosocial skills and coping mechanisms to daily activities in order to function independently and reintegrate into community    Time  4    Period  Weeks    Status  On-going      OT SHORT TERM GOAL #3   Title  Pt will recall and/or apply 1-3 sleep hygiene strategies to improve function in  BADL routine upon reintegrating into community    Time  4    Period  Weeks    Status  On-going      OT SHORT TERM GOAL #4   Title  Pt will create and implement functional BADL/IADL routine to facilitate successful engagement in community dwelling    Time  4    Period  Weeks    Status  On-going      OT SHORT TERM GOAL #5   Title  Pt will engage in goal setting to improve functional BADL/IADL routine upon reintegrating into community.    Time  4    Period  Weeks    Status  On-going               Plan - 08/18/19 1148    Occupational performance deficits (Please refer to evaluation for details):  ADL's;IADL's;Rest and Sleep;Work;Education;Leisure;Social Participation    Body Structure / Function / Physical Skills  ADL;IADL    Cognitive Skills  Energy/Drive    Psychosocial Skills  Coping Strategies;Interpersonal Interaction;Routines and Behaviors;Habits;Environmental  Adaptations       Patient will benefit from skilled therapeutic intervention in order to improve the following deficits and impairments:   Body Structure / Function / Physical Skills: ADL, IADL Cognitive Skills: Energy/Drive Psychosocial Skills: Coping Strategies, Interpersonal Interaction, Routines and Behaviors, Habits, Environmental  Adaptations   Visit Diagnosis: Severe episode of recurrent major depressive disorder, without psychotic features ([place])  Difficulty coping    Problem List Patient Active Problem List   Diagnosis Date Noted  . Major depressive disorder, recurrent episode, severe ([place]) 08/04/2019  . Attention deficit disorder 03/11/2016  . 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 SVT (supraventricular tachycardia) ([place]) 03/14/2015  . History of aplastic anemia 03/08/2013  . 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

## 2019-08-19 ENCOUNTER — Other Ambulatory Visit (HOSPITAL_COMMUNITY): Payer: BC Managed Care – PPO | Attending: Psychiatry | Admitting: Occupational Therapy

## 2019-08-19 ENCOUNTER — Encounter (HOSPITAL_COMMUNITY): Payer: Self-pay | Admitting: Occupational Therapy

## 2019-08-19 ENCOUNTER — Telehealth (HOSPITAL_COMMUNITY): Payer: Self-pay | Admitting: Psychiatry

## 2019-08-19 ENCOUNTER — Other Ambulatory Visit: Payer: Self-pay

## 2019-08-19 ENCOUNTER — Other Ambulatory Visit (HOSPITAL_COMMUNITY): Payer: BC Managed Care – PPO | Admitting: Licensed Clinical Social Worker

## 2019-08-19 DIAGNOSIS — Z9114 Patient's other noncompliance with medication regimen: Secondary | ICD-10-CM | POA: Diagnosis not present

## 2019-08-19 DIAGNOSIS — F909 Attention-deficit hyperactivity disorder, unspecified type: Secondary | ICD-10-CM | POA: Diagnosis not present

## 2019-08-19 DIAGNOSIS — F332 Major depressive disorder, recurrent severe without psychotic features: Secondary | ICD-10-CM

## 2019-08-19 DIAGNOSIS — Z79899 Other long term (current) drug therapy: Secondary | ICD-10-CM | POA: Insufficient documentation

## 2019-08-19 DIAGNOSIS — G4700 Insomnia, unspecified: Secondary | ICD-10-CM | POA: Insufficient documentation

## 2019-08-19 DIAGNOSIS — R45851 Suicidal ideations: Secondary | ICD-10-CM | POA: Diagnosis not present

## 2019-08-19 DIAGNOSIS — R4589 Other symptoms and signs involving emotional state: Secondary | ICD-10-CM

## 2019-08-19 DIAGNOSIS — F17210 Nicotine dependence, cigarettes, uncomplicated: Secondary | ICD-10-CM | POA: Insufficient documentation

## 2019-08-19 DIAGNOSIS — F419 Anxiety disorder, unspecified: Secondary | ICD-10-CM | POA: Diagnosis not present

## 2019-08-19 NOTE — Therapy (Signed)
[facility] PARTIAL HOSPITALIZATION PROGRAM [address] [place], [name], [address] Phone: [phone]   Fax:  [phone]  Occupational Therapy Treatment  Patient Details  Name: Bruce Warner MRN: [id number] Date of Birth: [birth year] Referring Provider (OT): Hillery Jacks, NP  Virtual Visit via Video Note  I connected with [name] on 08/19/19 at  8:00 AM EDT by a video enabled telemedicine application and verified that I am speaking with the correct person using two identifiers.   I discussed the limitations of evaluation and management by telemedicine and the availability of in person appointments. The patient expressed understanding and agreed to proceed.  I discussed the assessment and treatment plan with the patient. The patient was provided an opportunity to ask questions and all were answered. The patient agreed with the plan and demonstrated an understanding of the instructions.   The patient was advised to call back or seek an in-person evaluation if the symptoms worsen or if the condition fails to improve as anticipated.  I provided 60 minutes of non-face-to-face time during this encounter.   [name], OT    Encounter Date: 08/19/2019  OT End of Session - 08/19/19 1802    Visit Number  7    Number of Visits  12    Date for OT Re-Evaluation  09/03/19    Authorization Type  BCBS    OT Start Time  1100    OT Stop Time  1200    OT Time Calculation (min)  60 min    Activity Tolerance  Patient tolerated treatment well    Behavior During Therapy  WFL for tasks assessed/performed       Past Medical History:  Diagnosis Date  . ADHD (attention deficit hyperactivity disorder)   . Aplastic anemia (HCC) 2005   s/p transplant 2005  . Depression   . Seasonal allergies   . SVT (supraventricular tachycardia) (HCC) 2009   s/p ablation 2009    Past Surgical History:  Procedure Laterality Date  . BONE MARROW TRANSPLANT  2005    trnasplant from his brother  . CARDIAC ELECTROPHYSIOLOGY MAPPING AND ABLATION  2009   for svt    There were no vitals filed for this visit.  Subjective Assessment - 08/19/19 1801    Currently in Pain?  No/denies       S: I want to center my goals around personal growth  O: Education given on self-accountability being in line with personal values and goals to maintain occupational balance in various community settings. Pt given goal identifying worksheet to list immediate, short term, medium term, and long-term goals using a SMART goal framework (specificity, meaningful, adaptive, realistic, and time bound). Goals created as guideline for pt to practice being accountable in various situations. Pt completed work sheet of goals and encouraged to share goals with the group, with emphasis on immediate goal for check in with pt for next session to maintain accountability.   A: Pt presents with blunted affect, engaged and participatory. Centered goals around personal growth/health and work. Pt states how he wants to perform a consistent daily routine. He also has goals to work on his resume and portfolio.  P: OT group will be x3 per week while pt in PHP                OT Education - 08/19/19 1801    Education Details  education given on SMART goals    Person(s) Educated  Patient  Methods  Explanation;Handout    Comprehension  Verbalized understanding       OT Short Term Goals - 08/09/19 2002      OT SHORT TERM GOAL #1   Title  Pt will be educated on strategies to improve psychosocial skills needed to participate fully in all daily, work, and leisure activities    Time  4    Period  Weeks    Status  On-going    Target Date  09/03/19      OT SHORT TERM GOAL #2   Title  Pt will apply psychosocial skills and coping mechanisms to daily activities in order to function independently and reintegrate into community    Time  4    Period  Weeks    Status  On-going      OT  SHORT TERM GOAL #3   Title  Pt will recall and/or apply 1-3 sleep hygiene strategies to improve function in BADL routine upon reintegrating into community    Time  4    Period  Weeks    Status  On-going      OT SHORT TERM GOAL #4   Title  Pt will create and implement functional BADL/IADL routine to facilitate successful engagement in community dwelling    Time  4    Period  Weeks    Status  On-going      OT SHORT TERM GOAL #5   Title  Pt will engage in goal setting to improve functional BADL/IADL routine upon reintegrating into community.    Time  4    Period  Weeks    Status  On-going               Plan - 08/19/19 1802    Occupational performance deficits (Please refer to evaluation for details):  ADL's;IADL's;Rest and Sleep;Work;Education;Leisure;Social Participation    Body Structure / Function / Physical Skills  ADL;IADL    Cognitive Skills  Energy/Drive    Psychosocial Skills  Coping Strategies;Interpersonal Interaction;Routines and Behaviors;Habits;Environmental  Adaptations       Patient will benefit from skilled therapeutic intervention in order to improve the following deficits and impairments:   Body Structure / Function / Physical Skills: ADL, IADL Cognitive Skills: Energy/Drive Psychosocial Skills: Coping Strategies, Interpersonal Interaction, Routines and Behaviors, Habits, Environmental  Adaptations   Visit Diagnosis: Severe episode of recurrent major depressive disorder, without psychotic features ([place])  Difficulty coping    Problem List Patient Active Problem List   Diagnosis Date Noted  . Major depressive disorder, recurrent episode, severe ([place]) 08/04/2019  . Attention deficit disorder 03/11/2016  . 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 SVT (supraventricular tachycardia) ([place]) 03/14/2015  . History of aplastic anemia 03/08/2013  . 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

## 2019-08-19 NOTE — Telephone Encounter (Signed)
D: Pt will be transitioning to [facility] from [place] on 08-23-19.  A:  Placed call to orient patient, but there was no answer.  Left vm with phone #.

## 2019-08-19 NOTE — Progress Notes (Signed)
Pt attended spiritual care group  08/18/2019  11:00-12:00.  Group met via web-ex due to COVID-19 precautions.  Group facilitated by [name], MDiv, [place]   Group focused on topic of "community."  Members reflected on topic in facilitated dialog, identifying responses to topic and notions they hold of community from their previous experience.  Group members utilized value sort cards to identify top qualities they look for in community.  Engaged in facilitated dialog around their value choices, noting origin of these values, how these are realized in their lives, and strategies for engaging these values.    Spiritual care group drew on Motivational Interviewing, Narrative and Adlerian modalities.  Bruce Warner was present throughout group.  Engaged in group discussion.  Noted how his values in relationship / community differ from his father's - relating story about his father's concern for cleanliness and rule following.  Bruce Warner listed core values as "humor, compassion, self-control, realism"  Noted he seeks others who have self-awareness and compassion for themselves.  Spoke with group about offering this to himself as well.

## 2019-08-20 ENCOUNTER — Other Ambulatory Visit: Payer: Self-pay

## 2019-08-20 ENCOUNTER — Other Ambulatory Visit (HOSPITAL_COMMUNITY): Payer: BC Managed Care – PPO | Admitting: Licensed Clinical Social Worker

## 2019-08-20 ENCOUNTER — Encounter (HOSPITAL_COMMUNITY): Payer: Self-pay | Admitting: Family

## 2019-08-20 ENCOUNTER — Encounter (HOSPITAL_COMMUNITY): Payer: Self-pay | Admitting: Occupational Therapy

## 2019-08-20 ENCOUNTER — Telehealth (HOSPITAL_COMMUNITY): Payer: Self-pay | Admitting: Psychiatry

## 2019-08-20 ENCOUNTER — Other Ambulatory Visit (HOSPITAL_COMMUNITY): Payer: BC Managed Care – PPO | Admitting: Occupational Therapy

## 2019-08-20 DIAGNOSIS — R4589 Other symptoms and signs involving emotional state: Secondary | ICD-10-CM

## 2019-08-20 DIAGNOSIS — F332 Major depressive disorder, recurrent severe without psychotic features: Secondary | ICD-10-CM

## 2019-08-20 NOTE — Progress Notes (Signed)
Virtual Visit via Video Note  I connected with Bruce Warner on 08/20/19 at  9:00 AM EDT by a video enabled telemedicine application and verified that I am speaking with the correct person using two identifiers.   I discussed the limitations of evaluation and management by telemedicine and the availability of in person appointments. The patient expressed understanding and agreed to proceed.   I discussed the assessment and treatment plan with the patient. The patient was provided an opportunity to ask questions and all were answered. The patient agreed with the plan and demonstrated an understanding of the instructions.   The patient was advised to call back or seek an in-person evaluation if the symptoms worsen or if the condition fails to improve as anticipated.  I provided 00 minutes of non-face-to-face time during this encounter.   Derrill Center, NP   [facility] Behavioral Health Partial Hosptilization Outpatient Program Discharge Summary  Bruce Warner [id number]  Admission date: 08/06/2019 Discharge date: 08/20/2019  Reason for admission: Bruce Warner  is a 23 y.o. Caucasian male presents with worsening  depression and passive suicidal ideations.  He reports feeling suicidal roughly about 2 weeks ago.  Continues to express depression and depressive symptoms.  He reports struggling with depression since the sixth grade.  He reports he has been prescribed Lexapro, Wellbutrin, Pristiq, Lexapro without any symptom relief.  He reports he was recently discharged from his psychiatrist practice due to noncompliance with medication.  And stated" I refused to tell her my plan" avoids to harm him his self.  He denies auditory or visual hallucinations.  Reports restless sleep where he states he has been taking Benadryl and over-the-counter Unisom to aid with his symptoms.  Discussed initiating Vistaril 25 to 50 mg p.o. nightly patient was receptive to plan.  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  Patient reports he recently contacted [name] office for follow-up care  Chemical Use History: Denied   Family of Origin Issues: Reported that is aunt and uncle continues to be supportive.  Progress in Program Toward Treatment Goals: Ongoing, Patient attended and participated with daily group sessions with active and engaged participation. Bruce Warner was initiated on Vistaril 50 mg po QHS to aid with reported insomnia issues. Reported his sleep has improved as he has been working on his sleep hygiene regimen.    Progress (rationale): Ste[facility]    Take all medications as prescribed. Keep all follow-up appointments as scheduled.  Do not consume alcohol or use illegal drugs while on prescription medications. Report any adverse effects from your medications to your primary care provider promptly.  In the event of recurrent symptoms or worsening symptoms, call 911, a crisis hotline, or go to the nearest emergency department for evaluation.   Derrill Center, NP 08/20/2019

## 2019-08-20 NOTE — Therapy (Signed)
[place], [name], [address] Phone: [phone]   Fax:  [phone]  Occupational Therapy Treatment  Patient Details  Name: Bruce Warner MRN: [id number] Date of Birth: 02/20/1996 Referring Provider (OT): Ricky Ala, NP  Virtual Visit via Video Note  I connected with [name] on 08/20/19 at  8:00 AM EDT by a video enabled telemedicine application and verified that I am speaking with the correct person using two identifiers.   I discussed the limitations of evaluation and management by telemedicine and the availability of in person appointments. The patient expressed understanding and agreed to proceed.   I discussed the assessment and treatment plan with the patient. The patient was provided an opportunity to ask questions and all were answered. The patient agreed with the plan and demonstrated an understanding of the instructions.   The patient was advised to call back or seek an in-person evaluation if the symptoms worsen or if the condition fails to improve as anticipated.  I provided 60 minutes of non-face-to-face time during this encounter.   Zenovia Jarred, OT    Encounter Date: 08/20/2019  OT End of Session - 08/20/19 1652    Visit Number  8    Number of Visits  12    Date for OT Re-Evaluation  09/03/19    Authorization Type  BCBS    OT Start Time  1100    OT Stop Time  1200    OT Time Calculation (min)  60 min    Activity Tolerance  Patient tolerated treatment well    Behavior During Therapy  WFL for tasks assessed/performed       Past Medical History:  Diagnosis Date  . ADHD (attention deficit hyperactivity disorder)   . Aplastic anemia ([place]) 2005   s/p transplant 2005  . Depression   . Seasonal allergies   . SVT (supraventricular tachycardia) ([place]) 2009   s/p ablation 2009    Past Surgical History:  Procedure Laterality Date  . 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 CARDIAC ELECTROPHYSIOLOGY [place] AND ABLATION  2009   for svt    There were no vitals filed for this visit.  Subjective Assessment - 08/20/19 1652    Currently in Pain?  No/denies         S: I play too video games too much   O: Education given on routine management and its importance in increasing functional BADL/IADL independence. Education given on how to build daily routines, with various tips of organization and time management included. Home maintaining, meal preparation, child/pet care, work life balance, and medication management all discussed. Pt asked to share personal experiences and one new skill they would like to implement form session.   A: Pt presents with blunted affect, engaged and participatory. Continue to work on routine, build awareness and motivation for incrased engagement in healthy coping skills for pt. Pt endorses playing video games too often and having little routine. He shares that he is going to have friends and family help keep him accountable and start to limit this time. He shares that he still continues to lack motivation. He has plans to work on resume and applying to jobs but states he cannot get the drive to do these tasks. Pt appears to have the knowledge for change, but needs continued motivation and support to implement these startegies.  P: D/c this date  OT Education - 08/20/19 1652    Education Details  education given on routine building    Person(s) Educated  Patient    Methods  Explanation;Handout    Comprehension  Verbalized understanding       OT Short Term Goals - 08/20/19 1654      OT SHORT TERM GOAL #1   Title  Pt will be educated on strategies to improve psychosocial skills needed to participate fully in all daily, work, and leisure activities    Time  4    Period  Weeks    Status  Achieved    Target Date  09/03/19      OT SHORT TERM GOAL #2   Title  Pt will apply  psychosocial skills and coping mechanisms to daily activities in order to function independently and reintegrate into community    Time  4    Period  Weeks    Status  Achieved      OT SHORT TERM GOAL #3   Title  Pt will recall and/or apply 1-3 sleep hygiene strategies to improve function in BADL routine upon reintegrating into community    Period  Weeks    Status  Achieved      OT SHORT TERM GOAL #4   Title  Pt will create and implement functional BADL/IADL routine to facilitate successful engagement in community dwelling    Time  4    Period  Weeks    Status  Achieved      OT SHORT TERM GOAL #5   Title  Pt will engage in goal setting to improve functional BADL/IADL routine upon reintegrating into community.    Time  4    Period  Weeks    Status  Achieved               Plan - 08/20/19 1654    Occupational performance deficits (Please refer to evaluation for details):  ADL's;IADL's;Rest and Sleep;Work;Education;Leisure;Social Participation    Body Structure / Function / Physical Skills  ADL;IADL    Cognitive Skills  Energy/Drive    Psychosocial Skills  Coping Strategies;Interpersonal Interaction;Routines and Behaviors;Habits;Environmental  Adaptations       Patient will benefit from skilled therapeutic intervention in order to improve the following deficits and impairments:   Body Structure / Function / Physical Skills: ADL, IADL Cognitive Skills: Energy/Drive Psychosocial Skills: Coping Strategies, Interpersonal Interaction, Routines and Behaviors, Habits, Environmental  Adaptations   Visit Diagnosis: Severe episode of recurrent major depressive disorder, without psychotic features ([place])  Difficulty coping    Problem List Patient Active Problem List   Diagnosis Date Noted  . Major depressive disorder, recurrent episode, severe ([place]) 08/04/2019  . Attention deficit disorder 03/11/2016  . Adjustment reaction of adolescence with mixed disturbance of emotions and  conduct 03/11/2016  . SVT (supraventricular tachycardia) ([place]) 03/14/2015  . History of aplastic anemia 03/08/2013  . 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  Patient goals were met. Patient is being discharged due to meeting the stated rehab goals.  ?????        Zenovia Jarred, MSOT, OTR/L  Behavioral Health OT/ Acute Relief OT PHP Office: [phone]  Zenovia Jarred 08/20/2019, 4:55 PM  [facility] HOSPITALIZATION PROGRAM [place], [name], [address] Phone: [phone]   Fax:  [phone]  Name: CHAYNCE SCHAFER MRN: [id number] Date of Birth: 10-13-1996

## 2019-08-23 ENCOUNTER — Other Ambulatory Visit: Payer: Self-pay

## 2019-08-23 ENCOUNTER — Encounter (HOSPITAL_COMMUNITY): Payer: Self-pay | Admitting: Psychiatry

## 2019-08-23 ENCOUNTER — Other Ambulatory Visit (HOSPITAL_COMMUNITY): Payer: BC Managed Care – PPO | Admitting: Psychiatry

## 2019-08-23 DIAGNOSIS — F332 Major depressive disorder, recurrent severe without psychotic features: Secondary | ICD-10-CM
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
## 2019-08-23 NOTE — Progress Notes (Signed)
Psychiatric Initial Adult Assessment   Patient Identification: Bruce Warner MRN:  [id number] Date of Evaluation:  08/23/2019 Referral Source: PHP Chief Complaint: Passive suicidal ideation and depression Chief Complaint    Anxiety; Depression     Visit Diagnosis:    ICD-10-CM   1. Severe episode of recurrent major depressive disorder, without psychotic features ([place])  F33.2     History of Present Illness: Per admissions assessment note :Bruce Warner a 23 y.o. Caucasianmale presents with worsening depressionand passive suicidal ideations. He reports feeling suicidal roughly about 2 weeks ago. Continues to express depression and depressive symptoms. He reports struggling with depression since the sixth grade. He reports he has been prescribed Lexapro, Wellbutrin, Pristiq, Lexapro without any symptom relief. He reports he was recently discharged from his psychiatrist practice due to noncompliance with medication. And stated"I refused to tell her my plan"avoids to harm him his self.He denies auditory or visual hallucinations. Reports restless sleep where he states he has been taking Benadryl and over-the-counter Unisom to aid with his symptoms. Discussed initiating Vistaril 25 to 50 mg p.o. nightly patient was receptive to plan. 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  Evaluation at discharge: Reported slight improvement with mood since attending partial hospitalization programming.  Continues to struggle with finding structure to fill his day-to-day activities.  Patient reports low motivation to change, poor concentration and mild depression.  Denies suicidal or homicidal ideations.  Denies auditory or visual hallucinations.  Patient was initiated on Vistaril 50 mg p.o. nightly for insomnia reports his sleep has improved when he is on a routine.  States he has been attempting to get adequate  enough sleep at night and turning off the video games.  Patient to transition into the intensive outpatient programming.  We will continue to monitor for symptoms.  Associated Signs/Symptoms: Depression Symptoms:  depressed mood, difficulty concentrating, anxiety, disturbed sleep, (Hypo) Manic Symptoms:  Distractibility, Irritable Mood, Labiality of Mood, Anxiety Symptoms:  Excessive Worry, Psychotic Symptoms:  Hallucinations: None PTSD Symptoms: NA  Past Psychiatric History: Recently completed partial hospitalization programming.  Patient was followed by [name] outpatient care for depression, anxiety and oppositional defiance.  Previous Psychotropic Medications: No   Substance Abuse History in the last 12 months:  Yes.    Consequences of Substance Abuse: NA  Past Medical History:  Past Medical History:  Diagnosis Date  . ADHD (attention deficit hyperactivity disorder)   . Aplastic anemia ([place]) 2005   s/p transplant 2005  . Depression   . Seasonal allergies   . SVT (supraventricular tachycardia) ([place]) 2009   s/p ablation 2009    Past Surgical History:  Procedure Laterality Date  . 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 CARDIAC ELECTROPHYSIOLOGY MAPPING AND ABLATION  2009   for svt    Family Psychiatric History:   Family History:  Family History  Problem Relation Age of Onset  . Hypertension Maternal Grandfather   . Heart disease Paternal Grandfather   . Anxiety disorder Mother   . ADD / ADHD Sister   . Depression Sister   . Anxiety disorder Sister   . Alcohol abuse Sister   . ADD / ADHD Brother   . AAA (abdominal aortic aneurysm) Brother   . Hyperlipidemia Maternal Grandmother   . Hypertension Maternal Grandmother   . Stroke Maternal Grandmother     Social History:   Social History  Socioeconomic History  . Marital status: Single    Spouse name: Not on file  . Number of children: 0  . Years of education: Not on file   . Highest education level: Some college, no degree  Occupational History    Comment: [employer]  Social Needs  . Financial resource strain: Not hard at all  . Food insecurity    Worry: Never true    Inability: Never true  . Transportation needs    Medical: Yes    Non-medical: Yes  Tobacco Use  . Smoking status: Current Every Day Smoker    Types: Cigarettes  . Smokeless tobacco: Never Used  . Tobacco comment: 1/2 pack per day  Substance and Sexual Activity  . Alcohol use: Yes    Alcohol/week: 2.0 standard drinks    Types: 2 Standard drinks or equivalent per week    Comment: weekly - binge drinking - 10 beers at a time  . Drug use: No    Comment: Marijuana - past use   . Sexual activity: Never  Lifestyle  . Physical activity    Days per week: 0 days    Minutes per session: 0 min  . Stress: Not at all  Relationships  . Social Musicianconnections    Talks on phone: Three times a week    Gets together: Never    Attends religious service: Never    Active member of club or organization: No    Attends meetings of clubs or organizations: Never    Relationship status: Never married  Other Topics Concern  . Not on file  Social History Narrative   Lives with sister    Additional Social History:   Allergies:  No Known Allergies  Metabolic Disorder Labs: No results found for: HGBA1C, MPG No results found for: PROLACTIN Lab Results  Component Value Date   CHOL 114 05/25/2012   TRIG 52 05/25/2012   HDL 56 05/25/2012   CHOLHDL 2.0 05/25/2012   VLDL 10 05/25/2012   LDLCALC 48 05/25/2012   Lab Results  Component Value Date   TSH 1.758 06/26/2015    Therapeutic Level Labs: No results found for: LITHIUM No results found for: CBMZ No results found for: VALPROATE  Current Medications: Current Outpatient Medications  Medication Sig Dispense Refill  . carbamide peroxide (DEBROX) 6.5 % otic solution Place 5 drops into both ears 2 (two) times daily. 15 mL 1  . hydrOXYzine  (VISTARIL) 25 MG capsule Take 1 capsule (25 mg total) by mouth at bedtime as needed. 30 capsule 0  . ibuprofen (ADVIL,MOTRIN) 200 MG tablet Take 200 mg by mouth every 6 (six) hours as needed.    Marland Kitchen. lisdexamfetamine (VYVANSE) 30 MG capsule Take 1 capsule (30 mg total) by mouth daily. Do not fill before 12/08/16-01/08/17 30 capsule 0  . lisdexamfetamine (VYVANSE) 30 MG capsule Take 1 capsule (30 mg total) by mouth daily. Fill dates 01/08/17-02/03/17 30 capsule 0  . lisdexamfetamine (VYVANSE) 30 MG capsule Take 1 capsule (30 mg total) by mouth daily. 02/03/17-03/05/17 Fill dates 30 capsule 0  . Oxcarbazepine (TRILEPTAL) 300 MG tablet Take 300 mg by mouth 2 (two) times daily.    . traZODone (DESYREL) 50 MG tablet Take 0.5-1 tablets (25-50 mg total) by mouth at bedtime as needed for sleep. 30 tablet 3  . vortioxetine HBr (TRINTELLIX) 20 MG TABS tablet Take 1 tablet (20 mg total) by mouth daily. 30 tablet 0   No current facility-administered medications for this visit.  Musculoskeletal: Strength & Muscle Tone: within normal limits Gait & Station: normal Patient leans: N/A  Psychiatric Specialty Exam: ROS  There were no vitals taken for this visit.There is no height or weight on file to calculate BMI.  General Appearance: Casual  Eye Contact:  Good  Speech:  Clear and Coherent  Volume:  Normal  Mood:  Anxious and Depressed  Affect:  Congruent  Thought Process:  Coherent  Orientation:  Full (Time, Place, and Person)  Thought Content:  WDL  Suicidal Thoughts:  No  Homicidal Thoughts:  No  Memory:  Immediate;   Fair Recent;   Fair Remote;   Fair  Judgement:  Fair  Insight:  Fair  Psychomotor Activity:  Normal  Concentration:  Concentration: Fair  Recall:  [employer] of Knowledge:Fair  Language: Fair  Akathisia:  No  Handed:  Left  AIMS (if indicated):    Assets:  Communication Skills Leisure Time Physical Health Social Support  ADL's:  Intact  Cognition: WNL  Sleep:  Fair    Screenings: PHQ2-9     Counselor from 08/18/2019 in BEHAVIORAL HEALTH PARTIAL HOSPITALIZATION PROGRAM Counselor from 08/06/2019 in BEHAVIORAL HEALTH PARTIAL HOSPITALIZATION PROGRAM Office Visit from 11/25/2016 in Primary Care at [facility] Visit from 11/07/2016 in Primary Care at [facility] Visit from 10/03/2016 in Primary Care at [facility] Total Score  3  6  0  1  0  PHQ-9 Total Score  7  20  -  -  -      Assessment and Plan:  Admitted to intensive outpatient programming Continue medications as directed  Treatment plan was reviewed and agreed upon by NP T. Melvyn Neth and patient Bruce Warner need for continued group services   Oneta Rack, NP 10/5/202011:24 AM

## 2019-08-23 NOTE — Psych (Signed)
Virtual Visit via Video Note  I connected with Bruce Warner on 08/17/19 at  9:00 AM EDT by a video enabled telemedicine application and verified that I am speaking with the correct person using two identifiers.   I discussed the limitations of evaluation and management by telemedicine and the availability of in person appointments. The patient expressed understanding and agreed to proceed.   I discussed the assessment and treatment plan with the patient. The patient was provided an opportunity to ask questions and all were answered. The patient agreed with the plan and demonstrated an understanding of the instructions.   The patient was advised to call back or seek an in-person evaluation if the symptoms worsen or if the condition fails to improve as anticipated.  Pt was provided 240 minutes of non-face-to-face time during this encounter.   Donia Guiles, LCSW    [facility] PHP THERAPIST PROGRESS NOTE  Bruce Warner  Session Time: 9:00 - 10:00  Participation Level: Active  Behavioral Response: CasualAlertDepressed  Type of Therapy: Group Therapy  Treatment Goals addressed: Coping  Interventions: CBT, DBT, Supportive and Reframing  Summary: Clinician led check-in regarding current stressors and situation, and review of patient completed daily inventory. Clinician utilized active listening and empathetic response and validated patient emotions. Clinician facilitated processing group on pertinent issues.   Therapist Response: Bruce Warner is a 23 y.o. male who presents with depression symptoms.  Patient arrived within time allowed and reports that he is feeling "good." Patient rates his mood at a 5.5 on a scale of 1-10 with 10 being great. Pt reports he slept well, getting 9 hours and that's helping his mood. Pt reports he played basketball, video games, and texted with friends yesterday. Patient engaged in discussion.      Session Time: 10:00 -11:00  Participation  Level: Active  Behavioral Response: CasualAlertDepressed  Type of Therapy: Group Therapy, psychoeducation, psychotherapy  Treatment Goals addressed: Coping  Interventions: CBT, DBT, Solution Focused, Supportive and Reframing  Summary: Cln led discussion on unhealthy relationship dynamics. Group members discussed issues with knowing when to walk away, how to know if the unhealthy dynamic is an exception or rule; and how to manage the struggle between feelings and logic.  Therapist Response: Pt participates in discussion by offering feedback and support to other group members.        Session Time: 11:00- 12:00  Participation Level: Active  Behavioral Response: CasualAlertDepressed  Type of Therapy: Group Therapy, Psychoeducation; Psychotherapy  Treatment Goals addressed: Coping  Interventions: CBT; Solution focused; Supportive; Reframing  Summary: Clinician introduced topic of vulnerability. Group viewed TED talk "The power of vulnerability."  Therapist Response: Patient engaged in discussion on vulnerability and reports understanding of what vulnerability is and how it can affect [place].        Session Time: 12:00 -1:00  Participation Level:Active  Behavioral Response:CasualAlertDepressed  Type of Therapy: Group Therapy, OT  Treatment Goals addressed: Coping  Interventions:Psychosocial skills training, Supportive,   Summary:12:00 - 12:50:Occupational Therapy group 12:50 -1:00 Clinician led check-out. Clinician assessed for immediate needs, medication compliance and efficacy, and safety concerns   Therapist Response: 12:00 - 12:50: Patient engaged in group. See OT note.  12:50 - 1:00: At check-out, patient rates his mood at a 6.5 on a scale of 1-10 with 10 being great. Pt states afternoon plans of connecting with an old friend via video games. Patient demonstrates some progress as evidenced by getting out of the house for prosocial  activity yesterday.  Patient denies SI/HI/self-harm at the end of group.     Suicidal/Homicidal: Nowithout intent/plan  Plan: Pt will continue in PHP while working to decrease depression symptoms and increase ability to manage symptoms in a healthy manner as they arise.   Diagnosis: Severe episode of recurrent major depressive disorder, without psychotic features ([place]) [F33.2]    1. Severe episode of recurrent major depressive disorder, without psychotic features ([place])       Lorin Glass, LCSW 08/23/2019

## 2019-08-23 NOTE — Progress Notes (Signed)
Virtual Visit via Video Note  I connected with [name] on 08/23/19 at  9:00 AM EDT by a video enabled telemedicine application and verified that I am speaking with the correct person using two identifiers.  Location: Patient: Bruce Warner Provider: Lise Auer, LCSW   I discussed the limitations of evaluation and management by telemedicine and the availability of in person appointments. The patient expressed understanding and agreed to proceed.  History of Present Illness: MDD   Observations/Objective: Case Manager checked in with all participants to review discharge dates, insurance authorizations, work-related documents and needs for the treatment team. Counselor processed current mood and functioning and discussed how participants spent their time since last session and if skills were applied. Today was Brasen's first IOP group session, as he recently stepped down from [facility]. Tung participated in all activities, presenting with moderate depressive and anxiety symptoms. Zigmund struggles with motivation, direction, accountability and self-confidence. He is in treatment for SI, denying active SI today. Counselor shared information on The 5 Love Languages, then prompted group members to take the quiz. Group members shared their results and Counselor provided psychoeducation on how to connect results with application within relationships. Montarius discovered that his love language is Words of Affirmation and discussed what statements he appreciates hearing from [place], friends and family. Counselor provided group with a video named, "The Cycle of Anxiety and Avoidance" and a connected handout breaking down the cycle. Counselor explored thoughts and feelings related to the video and handout with group members. Jalil shared about his last work situation and mental health concerns, as it relates to a gap in employment. He is anxious about starting the process of finding future work and explained his avoidance  patterns around this. Counselor engaged group in a final handout called, "Worry Exploration", teaching the skill of changing the process of ruminations to change over to positive psychology. Group members each reported on their responses on the handout. Japheth walked the group through one of his "worries" of "not living up to his full potential," Group closed out by sharing one positive affirmation about themselves and each group member. Demitrious was easily about to identify strengths in others, but struggled to give himself a positive statement.   Assessment and Plan: Counselor recommends that patient remains in IOP treatment to better manage mental health symptoms and continue to address treatment plan goals. Counselor recommends adherence to crisis/safety plan, taking medications as prescribed and following up with medical professionals if any issues arise.   Follow Up Instructions: Counselor will send Webex link for next session.    I discussed the assessment and treatment plan with the patient. The patient was provided an opportunity to ask questions and all were answered. The patient agreed with the plan and demonstrated an understanding of the instructions.   The patient was advised to call back or seek an in-person evaluation if the symptoms worsen or if the condition fails to improve as anticipated.  I provided 180 minutes of non-face-to-face time during this encounter.   Lise Auer, LCSW

## 2019-08-24 ENCOUNTER — Other Ambulatory Visit (HOSPITAL_COMMUNITY): Payer: BC Managed Care – PPO | Admitting: Psychiatry

## 2019-08-24 ENCOUNTER — Other Ambulatory Visit: Payer: Self-pay

## 2019-08-24 ENCOUNTER — Encounter (HOSPITAL_COMMUNITY): Payer: Self-pay | Admitting: Psychiatry

## 2019-08-24 DIAGNOSIS — F332 Major depressive disorder, recurrent severe without psychotic features: Secondary | ICD-10-CM | POA: Diagnosis not present

## 2019-08-24 NOTE — Progress Notes (Signed)
Virtual Visit via Video Note  I connected with [name] on 08/24/19 at  9:00 AM EDT by a video enabled telemedicine application and verified that I am speaking with the correct person using two identifiers.  Location: Patient: Bruce Warner Provider: Lise Auer, LCSW   I discussed the limitations of evaluation and management by telemedicine and the availability of in person appointments. The patient expressed understanding and agreed to proceed.  History of Present Illness: MDD   Observations/Objective: Case Manager checked in with all participants to review discharge dates, insurance authorizations, work-related documents and needs for the treatment team. Counselor processed current mood and functioning and discussed how participants spent their time since last session and if skills were applied. Geoffery shared that he received a good nights sleep. However, he is concerned with the amount of time he is spending on video games to escape personal problems and negative cognitions.  Counselor prompted the group to write down 5 comforts, sharing how comfort items can be used for grounding. Rowan had a difficult time identifying comfort items, but understood how having these around could be helpful in coping.  Counselor engaged the group in a therapeutic activity called "Inside-Out Masks" to explore how the present externally vs. what they are experiencing internally. Oswald was critical of his artistic abilities with his original drawing, so he decided to do an image of a t-shirt with written messages upon it. Internally Dontavion is feeling like an imposter, flaws, past mistakes, and not special. Externally he shows that he is humorous, stylish, outgoing and creative. We discussed how the internal and external are connected. Counselor shared strategies on communicating about their Mental Health with friends, family, co-workers and strangers, using the "W.I.S.E. Method". Group members shared ways they could  implement this method. Counselor shared a video and article on the topic of strategies to use when making major life decisions, as all group members report being at a crossroads in how they would like to move forward in life. Elmer stated that he did not get much out of this content. He presented as more depressed and resistant to treatment today.  Counselor offered the graduating group member to choose a guided imagery for the group to engage in. She chose "Finding Your Authentic Self". Group members shared feedback from the experience. Less stated that he has difficulty engaging in meditations due to becoming distracted by other voices or thoughts in his mind. Counselor wrapped up the session by allowing group members to celebrate and encourage a graduating group member. The graduating member shared about her progress and takeaways from group and her encouraging words for the remaining group members.   Assessment and Plan: Counselor recommends that patient remains in IOP treatment to better manage mental health symptoms and continue to address treatment plan goals. Counselor recommends adherence to crisis/safety plan, taking medications as prescribed and following up with medical professionals if any issues arise.   Follow Up Instructions: Counselor will send Webex link for next session.    I discussed the assessment and treatment plan with the patient. The patient was provided an opportunity to ask questions and all were answered. The patient agreed with the plan and demonstrated an understanding of the instructions.   The patient was advised to call back or seek an in-person evaluation if the symptoms worsen or if the condition fails to improve as anticipated.  I provided 180 minutes of non-face-to-face time during this encounter.   Lise Auer, LCSW

## 2019-08-25 ENCOUNTER — Encounter (HOSPITAL_COMMUNITY): Payer: Self-pay

## 2019-08-25 ENCOUNTER — Other Ambulatory Visit: Payer: Self-pay

## 2019-08-25 ENCOUNTER — Other Ambulatory Visit (HOSPITAL_COMMUNITY): Payer: BC Managed Care – PPO | Admitting: Psychiatry

## 2019-08-25 DIAGNOSIS — F332 Major depressive disorder, recurrent severe without psychotic features: Secondary | ICD-10-CM

## 2019-08-25 DIAGNOSIS — R4589 Other symptoms and signs involving emotional state: Secondary | ICD-10-CM

## 2019-08-25 NOTE — Psych (Signed)
Virtual Visit via Video Note  I connected with Bruce Warner on 08/19/19 at  9:00 AM EDT by a video enabled telemedicine application and verified that I am speaking with the correct person using two identifiers.   I discussed the limitations of evaluation and management by telemedicine and the availability of in person appointments. The patient expressed understanding and agreed to proceed.   I discussed the assessment and treatment plan with the patient. The patient was provided an opportunity to ask questions and all were answered. The patient agreed with the plan and demonstrated an understanding of the instructions.   The patient was advised to call back or seek an in-person evaluation if the symptoms worsen or if the condition fails to improve as anticipated.  Pt was provided 240 minutes of non-face-to-face time during this encounter.   Bruce Glass, LCSW    [facility] PHP THERAPIST PROGRESS NOTE  Bruce Warner [id number]  Session Time: 9:00 - 10:00  Participation Level: Active  Behavioral Response: CasualAlertDepressed  Type of Therapy: Group Therapy  Treatment Goals addressed: Coping  Interventions: CBT, DBT, Supportive and Reframing  Summary: Clinician led check-in regarding current stressors and situation, and review of patient completed daily inventory. Clinician utilized active listening and empathetic response and validated patient emotions. Clinician facilitated processing group on pertinent issues.   Therapist Response: Bruce Warner is a 23 y.o. male who presents with depression symptoms.  Patient arrived within time allowed and reports that he is feeling "pretty average." Patient rates his mood at a 6.5 on a scale of 1-10 with 10 being great. Pt reports he spent his afternoon at an appointment, playing video games, and face timing with a friend. Pt reports struggle with confidence. Pt able to process. Patient engaged in discussion.      Session Time: 10:00  -11:00  Participation Level: Active  Behavioral Response: CasualAlertDepressed  Type of Therapy: Group Therapy, psychoeducation, psychotherapy  Treatment Goals addressed: Coping  Interventions: CBT, DBT, Solution Focused, Supportive and Reframing  Summary: Cln led discussion on the five love languages. Cln educated on what the 5 languages are, how they can be used to aid relationships, and the ways in which lack of awareness regarding alternate perspectives can damage communication and relationship. Group discussed how they can apply this information into their lives.   Therapist Response: Pt participates in discussion and identifies that he seeks verbal feedback or recognition from others. Pt identified one friend who he could share his love language with to increase his needs being met.        Session Time: 11:00- 12:00  Participation Level: Active  Behavioral Response: CasualAlertDepressed  Type of Therapy: Group Therapy, Psychoeducation; Psychotherapy  Treatment Goals addressed: Coping  Interventions: CBT; Solution focused; Supportive; Reframing  Summary: Clinician introduced topic of distress tolerance skills including what they are, how to practice, them, and reasonable expectations for utilizing them. Cln introduced ACCEPTS skills and group reviewed A-C-C and how to incorporate them into their lives.   Therapist Response: Patient engaged in discussion and reports understanding of distress tolerance skills purpose and limits. Pt identified watching YouTube videos and checking in on friends as ways he can utilize the skills discussed.       Session Time: 12:00 -1:00  Participation Level:Active  Behavioral Response:CasualAlertDepressed  Type of Therapy: Group Therapy, OT  Treatment Goals addressed: Coping  Interventions:Psychosocial skills training, Supportive,   Summary:12:00 - 12:50:Occupational Therapy group 12:50 -1:00  Clinician led check-out. Clinician assessed for immediate  needs, medication compliance and efficacy, and safety concerns   Therapist Response: 12:00 - 12:50: Patient engaged in group. See OT note.  12:50 - 1:00: At check-out, patient rates his mood at a 6 on a scale of 1-10 with 10 being great. Pt states afternoon plans of playing video games. Patient demonstrates some progress as evidenced by getting increased brightness in affect. Patient denies SI/HI/self-harm at the end of group.     Suicidal/Homicidal: Nowithout intent/plan  Plan: Pt will continue in PHP while working to decrease depression symptoms and increase ability to manage symptoms in a healthy manner as they arise.   Diagnosis: Severe episode of recurrent major depressive disorder, without psychotic features ([place]) [F33.2]    1. Severe episode of recurrent major depressive disorder, without psychotic features ([place])       Bruce Glass, LCSW 08/25/2019

## 2019-08-25 NOTE — Psych (Signed)
Virtual Visit via Video Note  I connected with Bruce Warner on 08/20/19 at  9:00 AM EDT by a video enabled telemedicine application and verified that I am speaking with the correct person using two identifiers.   I discussed the limitations of evaluation and management by telemedicine and the availability of in person appointments. The patient expressed understanding and agreed to proceed.   I discussed the assessment and treatment plan with the patient. The patient was provided an opportunity to ask questions and all were answered. The patient agreed with the plan and demonstrated an understanding of the instructions.   The patient was advised to call back or seek an in-person evaluation if the symptoms worsen or if the condition fails to improve as anticipated.  Pt was provided 240 minutes of non-face-to-face time during this encounter.   Bruce Glass, LCSW    [facility] PHP THERAPIST PROGRESS NOTE  Bruce Warner [id number]  Session Time: 9:00 - 10:00  Participation Level: Active  Behavioral Response: CasualAlertDepressed  Type of Therapy: Group Therapy  Treatment Goals addressed: Coping  Interventions: CBT, DBT, Supportive and Reframing  Summary: Clinician led check-in regarding current stressors and situation, and review of patient completed daily inventory. Clinician utilized active listening and empathetic response and validated patient emotions. Clinician facilitated processing group on pertinent issues.   Therapist Response: Bruce Warner is a 23 y.o. male who presents with depression symptoms.  Patient arrived within time allowed and reports that he is feeling "okay." Patient rates his mood at a 6.5 on a scale of 1-10 with 10 being great. Pt reports he did not sleep well and is experiencing a sore throat. Pt states he had a virtual doctor's appointment this morning regarding the issue and will have to go live this afternoon for further testing. Pt states after group yesterday  he took a nap and then played video games. Pt continues to struggle with motivation. Pt able to process. Patient engaged in discussion.      Session Time: 10:00 -11:00  Participation Level: Active  Behavioral Response: CasualAlertDepressed  Type of Therapy: Group Therapy, psychoeducation, psychotherapy  Treatment Goals addressed: Coping  Interventions: CBT, DBT, Solution Focused, Supportive and Reframing  Summary: Cln led discussion on accountability. Group members discussed ways in which they feel helped by external pressure and how to achieve or seek similar pressure that will aid in meeting goals.   Therapist Response: Pt participates in discussion and states he needs to be "uncomfortable" to change and he is comfortable now. Pt identifies conflict that he "knows" he needs to be uncomfortable, however he will have to make himself uncomfortable and he doesn't "want" to be. Pt is able to process. Pt identifies one friend who he would be comfortable asking to help give him accountability and is able to come up with some consequences that would make him uncomfortable.        Session Time: 11:00- 12:00  Participation Level: Active  Behavioral Response: CasualAlertDepressed  Type of Therapy: Group Therapy, Psychoeducation; Psychotherapy  Treatment Goals addressed: Coping  Interventions: CBT; Solution focused; Supportive; Reframing  Summary: Clinician continued topic of distress tolerance skills. Cln reviewed what was discussed yesterday and finished education on ACCEPTS skills, doing E-P-T-S. Group members discussed how to incorporate these skills into their lives.   Therapist Response: Patient engaged in discussion and reports understanding. Pt identified watching games on his phone, reciting song lyrics, and lack of privacy as ways he can utilize the skills discussed.  Session Time: 12:00 -1:00  Participation Level:Active  Behavioral  Response:CasualAlertDepressed  Type of Therapy: Group Therapy, OT  Treatment Goals addressed: Coping  Interventions:Psychosocial skills training, Supportive,   Summary:12:00 - 12:50:Occupational Therapy group 12:50 -1:00 Clinician led check-out. Clinician assessed for immediate needs, medication compliance and efficacy, and safety concerns   Therapist Response: 12:00 - 12:50: Patient engaged in group. See OT note.  12:50 - 1:00: At check-out, patient rates his mood at a 6.5 on a scale of 1-10 with 10 being great. Pt states afternoon plans of following up with his doctor, napping, and playing video games. Patient demonstrates some progress as evidenced by increased willingness to utilize skills learned. Patient denies SI/HI/self-harm at the end of group.     Suicidal/Homicidal: Nowithout intent/plan  Plan: Pt will discharge from PHP due to meeting treatment goals of decreased depression symptoms and increased ability to manage symptoms in a healthy manner as they arise. Pt and provider are aligned with discharge. Pt will step down to IOP within this agency to gain further support. Pt will begin IOP 10/5. Pt denies SI/HI at time of discharge.   Diagnosis: Severe episode of recurrent major depressive disorder, without psychotic features ([facility]) [F33.2]    1. Severe episode of recurrent major depressive disorder, without psychotic features ([facility])   2. Difficulty coping       Donia Guiles, LCSW 08/25/2019

## 2019-08-25 NOTE — Progress Notes (Signed)
Virtual Visit via Video Note  I connected with [name] on 08/25/19 at  9:00 AM EDT by a video enabled telemedicine application and verified that I am speaking with the correct person using two identifiers.  Location: Patient: Bruce Warner Provider: Lise Auer, LCSW   I discussed the limitations of evaluation and management by telemedicine and the availability of in person appointments. The patient expressed understanding and agreed to proceed.  History of Present Illness: MDD and difficulty coping   Observations/Objective: Case Manager checked in with all participants to review discharge dates, insurance authorizations, work-related documents and needs for the treatment team. Counselor processed current mood and functioning and discussed how participants spent their time since last session and if skills were applied. Bruce Warner shared that he was inactive yesterday, mostly playing video games and staying in his room. He reported having a conversation with his sister that caused him to have mixed emotions. Originally the conversation inspired him and he became motivated for change, then he allowed his negative cognitions to overtake his thought process, which left him feeling self-doubt and defeated. Group gave feedback on strategies to combat this pattern. Counselor introduced our guest speaker, Einar Grad, [place] Pharmacist, who shared about psychiatric medications, side effects, treatment considerations and how to communicate with medical professionals. Each group member asked questions and shared medication concerns. Bruce Warner engaged in fruitful conversation with the pharmacist. Counselor prompted group members to reference a worksheet called, "Body Scan" to jot down questions and concerns about their physical health in preparation for their upcoming appointments with medical professionals. Counselor allowed each participant to share their ideas with the group. Bruce Warner reports being concerned about his  sleep, an ulcer in this throat, and a skin condition he is attempting to treat. He plans to follow up with his providers. He also mentioned the need for a dental cleaning.  Counselor encouraged routine medical check-ups, preparing for appointments, following up with recommendations and seeking specialist if needed. Counselor introduced a handout called the "Self-care Wheel" which highlighted 6 different life domains and shared self-care tasks in each category. Group members discussed each domain and the self-care ideas they would like to add to their daily lives to improve overall well-being. Bruce Warner shared that his better wellness is with his physical and psychological health. His midrange domains were emotional and personal wellness. He would like to most focus on improving his professional and spiritual health. Some ways he identified this is working on debt, finding more inspiration and motivators, to go on dates, and to volunteer, as well as work on his portfolio and resume. Counselor wrapped up session by group members sharing about overall depression and anxiety scale, as well as sharing their plans for the afternoon. Bruce Warner shared that his anxiety was low to mild and his depression was moderate. He reported not having any plans for the afternoon, but stated he would spend less time on video games.    Assessment and Plan: Counselor recommends that patient remains in IOP treatment to better manage mental health symptoms and continue to address treatment plan goals. Counselor recommends adherence to crisis/safety plan, taking medications as prescribed and following up with medical professionals if any issues arise.   Follow Up Instructions: Counselor will send Webex link for next session.    I discussed the assessment and treatment plan with the patient. The patient was provided an opportunity to ask questions and all were answered. The patient agreed with the plan and demonstrated an understanding of the  instructions.  The patient was advised to call back or seek an in-person evaluation if the symptoms worsen or if the condition fails to improve as anticipated.  I provided 180 minutes of non-face-to-face time during this encounter.   Hilbert Odor, LCSW

## 2019-08-26 ENCOUNTER — Encounter (HOSPITAL_COMMUNITY): Payer: Self-pay

## 2019-08-26 ENCOUNTER — Other Ambulatory Visit (HOSPITAL_COMMUNITY): Payer: BC Managed Care – PPO | Admitting: Psychiatry

## 2019-08-26 ENCOUNTER — Other Ambulatory Visit: Payer: Self-pay

## 2019-08-26 DIAGNOSIS — F332 Major depressive disorder, recurrent severe without psychotic features: Secondary | ICD-10-CM

## 2019-08-26 NOTE — Progress Notes (Signed)
Virtual Visit via Video Note  I connected with Bruce Warner on 08/26/19 at  9:00 AM EDT by a video enabled telemedicine application and verified that I am speaking with the correct person using two identifiers.  Location: Patient: Bruce Warner Provider: Lise Auer, LCSW   I discussed the limitations of evaluation and management by telemedicine and the availability of in person appointments. The patient expressed understanding and agreed to proceed.  History of Present Illness: MDD  Observations/Objective: Case Manager checked in with all participants to review discharge dates, insurance authorizations, work-related documents and needs for the treatment team. Counselor introduced guest speaker, Bruce Warner, Bruce Warner, to facilitate a discussion around Grief and Loss topics. Bruce Warner engaged in the conversation sharing about his loss of identity. He is now able to label what he was feeling. Counselor allowed time for reflection and journaling. In reflecting he states that he is in the acceptance stage of accepting his mental health diagnosis and that he wants to focus on gaining more confidence moving forward. He will work on his thought process and positivity. Counselor engaged the group in an open discussion about Mental Health Resources they use or are aware of. Each group member shared, then the Counselor shared an exhaustive list of local, state, and national resources, as well as, aps and books that have been recommended. Counselor invited a Agricultural consultant from [place] to present on [place] groups, peer support, and wellness academy. Counselor prompted group members to identify which resources they are likely to look into and utilize to better maintain mental health. Bruce Warner shared some ideas and resources with the group. He identified that he will look into the blogs and support groups. Counselor closed session by group members sharing their plans for the  afternoon. Bruce Warner shared that he continues to isolate himself and stay home in his room the majority of the time. He rejected a family members invitation to [place] and eat lunch out. Counselor addressed ways he could make good use of his time and challenge himself to break out of his norm to get different results. Bruce Warner remains resistant to change and self-help.   Assessment and Plan: Counselor recommends that patient remains in IOP treatment to better manage mental health symptoms and continue to address treatment plan goals. Counselor recommends adherence to crisis/safety plan, taking medications as prescribed and following up with medical professionals if any issues arise.   Follow Up Instructions: Counselor will send Webex link for next session.    I discussed the assessment and treatment plan with the patient. The patient was provided an opportunity to ask questions and all were answered. The patient agreed with the plan and demonstrated an understanding of the instructions.   The patient was advised to call back or seek an in-person evaluation if the symptoms worsen or if the condition fails to improve as anticipated.  I provided 180 minutes of non-face-to-face time during this encounter.   Lise Auer, LCSW

## 2019-08-27 ENCOUNTER — Encounter (HOSPITAL_COMMUNITY): Payer: Self-pay | Admitting: Psychiatry

## 2019-08-27 ENCOUNTER — Other Ambulatory Visit (HOSPITAL_COMMUNITY): Payer: BC Managed Care – PPO | Admitting: Psychiatry

## 2019-08-27 ENCOUNTER — Other Ambulatory Visit: Payer: Self-pay

## 2019-08-27 DIAGNOSIS — F332 Major depressive disorder, recurrent severe without psychotic features: Secondary | ICD-10-CM

## 2019-08-27 NOTE — Progress Notes (Signed)
Virtual Visit via Video Note  I connected with Bruce Warner on 08/27/19 at  9:00 AM EDT by a video enabled telemedicine application and verified that I am speaking with the correct person using two identifiers.  Location: Patient: Bruce Warner Provider: Lise Auer, LCSW   I discussed the limitations of evaluation and management by telemedicine and the availability of in person appointments. The patient expressed understanding and agreed to proceed.  History of Present Illness: MDD   Observations/Objective: Case Manager checked in with all participants to review discharge dates, insurance authorizations, work-related documents and needs for the treatment team. Counselor processed current mood and functioning and discussed how participants spent their time since last session and if skills were applied. Bruce Warner continues to isolate himself in his room, napping and watching Netflix. He has not contacted a psychiatrist to follow him post group. Counselor engaged the group in a therapeutic activity, creating their Ecomaps. Counselor shared an example and walked them through creating their own. Each group member shared their reflections on their Ecomap, identifying the areas they would like to focus more energy and acknowledging their support system. Bruce Warner identified solid connections with his aunt and uncle, as well as his friends in [place]. He would like to put more energy into professional aspirations and repairing his relationship with his Dad and Step-mom. Counselor shared psychoinformation related to childhood traumas through explaining and administering the ACEs questionnaire. Group members shared their scores and how the can see the information connecting with their own health/mental health and within their family of origin. Bruce Warner scored a 3/10, connecting that his mom, aunt and uncle shielded him from having a higher score, based on his dads life path.  Counselor shared an additional video about the  topic and encouraged all group members to explore this area of their mental health in more detail with their personal counselors, requesting trauma informed treatment. Counselor administered the Resiliency Survey with the group to discuss the positive aspects of their childhoods and how they have overcome obstacles with the support of healthy connections and caregivers. Each group member shared their score and identified their childhood supports. Bruce Warner scored a 9/14 and shared that his aunt and uncle have been the most supportive of him, in addition to several soccer coaches and an outstanding 1st grade teacher. Counselor wrapped up session by group members sharing a gratitude statement, as well as sharing their plans for the weekend. Bruce Warner is grateful for his friends in [place]. He shared that his grandmother is on Hospice watch and he will be going to say a final good bye to her today, as well as picking up prescriptions and spending time with family.    Assessment and Plan: Counselor recommends that patient remains in IOP treatment to better manage mental health symptoms and continue to address treatment plan goals. Counselor recommends adherence to crisis/safety plan, taking medications as prescribed and following up with medical professionals if any issues arise.   Follow Up Instructions: Counselor will send Webex link for next session.    I discussed the assessment and treatment plan with the patient. The patient was provided an opportunity to ask questions and all were answered. The patient agreed with the plan and demonstrated an understanding of the instructions.   The patient was advised to call back or seek an in-person evaluation if the symptoms worsen or if the condition fails to improve as anticipated.  I provided 180 minutes of non-face-to-face time during this encounter.   Lise Auer, LCSW

## 2019-08-30 ENCOUNTER — Telehealth (HOSPITAL_COMMUNITY): Payer: Self-pay | Admitting: Psychiatry

## 2019-08-30 ENCOUNTER — Other Ambulatory Visit (HOSPITAL_COMMUNITY): Payer: BC Managed Care – PPO | Admitting: Psychiatry

## 2019-08-30 ENCOUNTER — Other Ambulatory Visit: Payer: Self-pay

## 2019-08-31 ENCOUNTER — Encounter (HOSPITAL_COMMUNITY): Payer: Self-pay

## 2019-08-31 ENCOUNTER — Other Ambulatory Visit (HOSPITAL_COMMUNITY): Payer: BC Managed Care – PPO | Admitting: Family

## 2019-08-31 DIAGNOSIS — F332 Major depressive disorder, recurrent severe without psychotic features: Secondary | ICD-10-CM | POA: Diagnosis not present

## 2019-08-31 NOTE — Progress Notes (Signed)
Virtual Visit via Video Note  I connected with Bruce Warner on 08/31/19 at  9:00 AM EDT by a video enabled telemedicine application and verified that I am speaking with the correct person using two identifiers.  Location: Patient: Bruce Warner Provider: Lise Auer, LCSW   I discussed the limitations of evaluation and management by telemedicine and the availability of in person appointments. The patient expressed understanding and agreed to proceed.  History of Present Illness: MDD   Observations/Objective: Case Manager checked in with all participants to review discharge dates, insurance authorizations, work-related documents and needs for the treatment team. Counselor processed current mood and functioning and discussed how participants spent their time since last session and if skills were applied. Bruce Warner shared that he was able to spend time with his grandmother who is receiving Hospice services. Bruce Warner presented as somber and found difficulty in participating in activity and discussion. Reporting stagnation in progress and increased lack of motivation and life engagement. Counselor assessed daily functioning. Bruce Warner remains in his room most of the time sleeping or playing video games, coming out to eat with family.   Counselor engaged the group in an activity called, "Cognitive Distortions of Self." Group members shared feedback on their written reflections. Bruce Warner had difficulty with the activity and found it hard to connect with the purpose. Counselor acknowledged his efforts. Counselor introduced the group to the concept of Right Brain vs Left Brain Functions. Counselor shared an article and video explaining the role of emotions and logic, as well as strategies in balancing these functions for sound decision making. Group members shared their thoughts on this topic and how it plays out in their thoughts and feelings. Bruce Warner identified that he is more connected with his left-brain. He finds himself to  be artistic and to be driven by his emotions. Counselor introduced the concept of "Locus of Control" and shared a video explaining the activity they completed in assessing their sphere of influence and control in current stressful situations. Group members shared their reflections. Bruce Warner explored his relationship with his depression, what he can control, and what he can influence. Counselor challenged some components of his reflections. Counselor checked in with all group members to share their plans for the afternoon and their overall mood. Bruce Warner stated that he intends on playing video games and connect with his peers. He presented with moderate to severe depression and mild anxiety.    Assessment and Plan: Counselor recommends that patient remains in IOP treatment to better manage mental health symptoms and continue to address treatment plan goals. Counselor recommends adherence to crisis/safety plan, taking medications as prescribed and following up with medical professionals if any issues arise.   Follow Up Instructions: Counselor will send Webex link for next session.    I discussed the assessment and treatment plan with the patient. The patient was provided an opportunity to ask questions and all were answered. The patient agreed with the plan and demonstrated an understanding of the instructions.   The patient was advised to call back or seek an in-person evaluation if the symptoms worsen or if the condition fails to improve as anticipated.  I provided 180 minutes of non-face-to-face time during this encounter.   Lise Auer, LCSW

## 2019-09-01 ENCOUNTER — Other Ambulatory Visit (HOSPITAL_COMMUNITY): Payer: BC Managed Care – PPO | Admitting: Psychiatry

## 2019-09-01 ENCOUNTER — Encounter (HOSPITAL_COMMUNITY): Payer: Self-pay | Admitting: Psychiatry

## 2019-09-01 ENCOUNTER — Other Ambulatory Visit: Payer: Self-pay

## 2019-09-01 DIAGNOSIS — R4589 Other symptoms and signs involving emotional state: Secondary | ICD-10-CM

## 2019-09-01 DIAGNOSIS — F332 Major depressive disorder, recurrent severe without psychotic features: Secondary | ICD-10-CM

## 2019-09-01 MED ORDER — HYDROXYZINE PAMOATE 25 MG PO CAPS: 25.0000 mg | ORAL_CAPSULE | Freq: Every evening | ORAL | 0 refills | Status: DC | PRN

## 2019-09-01 NOTE — Addendum Note (Signed)
Addended by: Derrill Center on: 09/01/2019 09:46 AM   Modules accepted: Orders

## 2019-09-01 NOTE — Progress Notes (Signed)
Virtual Visit via Video Note  I connected with [name] on 09/01/19 at  9:00 AM EDT by a video enabled telemedicine application and verified that I am speaking with the correct person using two identifiers.  Location: Patient: Bruce Warner Provider: Lise Auer, LCSW   I discussed the limitations of evaluation and management by telemedicine and the availability of in person appointments. The patient expressed understanding and agreed to proceed.  History of Present Illness: MDD and difficulty coping   Observations/Objective: Case Manager checked in with all participants to review discharge dates, insurance authorizations, work-related documents and needs for the treatment team. Counselor processed current mood and functioning and discussed how participants spent their time since last session and if skills were applied. Zay opened up more about getting to see his grandmother for the last time in hospice care. He shared about the guilt he is feeling over his apathy towards the situation. Counselor shared about grief and loss responses to normalize his feelings. Azael presented with moderate depression and mild anxiety.  Counselor presented on and covered 3 websites which discussed coping skills for anxiety, depression and "stressed out adults." Group members shared feedback and identified a variety of coping skills they are willing to implement to address mental health symptoms. Daniil stated that he would like to implement the following coping skills: skateboarding, going to the gym, making music, self-forgiveness, calming activities, reflecting back on the positives, and acceptance of real self vs ideal self. Counselor facilitated the group with a guided imagery script on "Becoming More Playful and Less Serious to expose them to deep breathing, progressive muscle relaxation and tension, positive affirmations, meditation and guided imagery. Group members shared how the experience was for them. Blaiden  shared that he was able to focus in more on my voice and get deeper into the meditation. In closing group members shared their plans for the afternoon. Stace plans to "go with the flow" this afternoon. Counselor continues to encourage him to stretch himself to apply more of the identified coping strategies.    Assessment and Plan: Counselor recommends that patient remains in IOP treatment to better manage mental health symptoms and continue to address treatment plan goals. Counselor recommends adherence to crisis/safety plan, taking medications as prescribed and following up with medical professionals if any issues arise.   Follow Up Instructions: Counselor will send Webex link for next session.    I discussed the assessment and treatment plan with the patient. The patient was provided an opportunity to ask questions and all were answered. The patient agreed with the plan and demonstrated an understanding of the instructions.   The patient was advised to call back or seek an in-person evaluation if the symptoms worsen or if the condition fails to improve as anticipated.  I provided 180 minutes of non-face-to-face time during this encounter.   Lise Auer, LCSW

## 2019-09-02 ENCOUNTER — Other Ambulatory Visit (HOSPITAL_COMMUNITY): Payer: BC Managed Care – PPO | Admitting: Psychiatry

## 2019-09-02 ENCOUNTER — Other Ambulatory Visit: Payer: Self-pay

## 2019-09-02 ENCOUNTER — Encounter (HOSPITAL_COMMUNITY): Payer: Self-pay

## 2019-09-02 DIAGNOSIS — F332 Major depressive disorder, recurrent severe without psychotic features: Secondary | ICD-10-CM | POA: Diagnosis not present

## 2019-09-02 NOTE — Progress Notes (Signed)
Virtual Visit via Video Note  I connected with [name] on 09/02/19 at  9:00 AM EDT by a video enabled telemedicine application and verified that I am speaking with the correct person using two identifiers.  Location: Patient: Bruce Warner Provider: Lise Auer, LCSW   I discussed the limitations of evaluation and management by telemedicine and the availability of in person appointments. The patient expressed understanding and agreed to proceed.  History of Present Illness: MDD   Observations/Objective: Case Manager was on vacation today, so Counselor checked in with all participants to review discharge dates, insurance authorizations, work-related documents and needs for the treatment team. Counselor engaged the group by checking in to determine who completed their self-care act and to see everyone's current state and mood. Christiano shared that he attended a social gathering with his uncle. He stated that it was not enjoyable or something he would normally attend, but that he tried to make the best of it. Choya presented as more interactive, happy and engaged today with moderate depression. Counselor introduced guest speaker, Jeanella Craze, Lead [employer], to facilitate a discussion around Grief and Loss topics. Verdie explored his losses related to work, status, and relationships. Counselor prompted the group to journal about the G&L issues they would like to further process with their individual therapist. Counselor spent time allowing group members to share encouraging and inspirational words for a group member who is graduating today. Farron is particularly close with the graduating member and shared very thoughtful and personal statements to wish him well on his mental health journey.  Counselor introduced Field seismologist, Jan Fireman, Yoga Instructor, to guide the group in a yoga practice. Counselor checked in with all participants to assess the benefits. Kennth shared that he was able to focus  more today, that it was a good practice and that he was able to control some distractions. Counselor closed by having group members share a self-care task and productivity activity they will do today. Luisenrique stated that he planned to play video games to relax and connect with his friends.   Assessment and Plan: Counselor recommends that patient remains in IOP treatment to better manage mental health symptoms and continue to address treatment plan goals. Counselor recommends adherence to crisis/safety plan, taking medications as prescribed and following up with medical professionals if any issues arise.   Follow Up Instructions: Counselor will send Webex link for next session.    I discussed the assessment and treatment plan with the patient. The patient was provided an opportunity to ask questions and all were answered. The patient agreed with the plan and demonstrated an understanding of the instructions.   The patient was advised to call back or seek an in-person evaluation if the symptoms worsen or if the condition fails to improve as anticipated.  I provided 180 minutes of non-face-to-face time during this encounter.   Lise Auer, LCSW

## 2019-09-03 ENCOUNTER — Other Ambulatory Visit (HOSPITAL_COMMUNITY): Payer: BC Managed Care – PPO | Admitting: Psychiatry

## 2019-09-03 ENCOUNTER — Encounter (HOSPITAL_COMMUNITY): Payer: Self-pay

## 2019-09-03 ENCOUNTER — Other Ambulatory Visit: Payer: Self-pay

## 2019-09-03 DIAGNOSIS — R4589 Other symptoms and signs involving emotional state: Secondary | ICD-10-CM

## 2019-09-03 DIAGNOSIS — F332 Major depressive disorder, recurrent severe without psychotic features: Secondary | ICD-10-CM

## 2019-09-03 NOTE — Progress Notes (Signed)
Virtual Visit via Video Note  I connected with [name] on 09/03/19 at  9:00 AM EDT by a video enabled telemedicine application and verified that I am speaking with the correct person using two identifiers.  Location: Patient: Bruce Warner Provider: Lise Auer, LCSW   I discussed the limitations of evaluation and management by telemedicine and the availability of in person appointments. The patient expressed understanding and agreed to proceed.  History of Present Illness: MDD   Observations/Objective: Case Manager checked in with all participants to review discharge dates, insurance authorizations, work-related documents and needs for the treatment team. Counselor processed current mood and functioning and discussed how participants spent their time since last session and if skills were applied. Azel shared that he applied for 4 jobs yesterday. Counselor and group celebrated this accomplishment, as he had not been motivated to do so in over 6 months. Adams down played the accomplishment, but acknowledged that it was working on his goals. Waldron was very engaged in today's session and demonstrated deeper processing and application. He presented with moderate depression and mild anxiety. Counselor prompted the group to journal on the topic of forgiveness. Group members shared their reflections. Jowan shared about his process in self-forgiveness and acceptance of self. He discussed the realization about link between his self-disapproval with his father's disapproval and pressures on him. Counselor shared information from an article called, "Myths and Stages of Forgiveness." Counselor engaged them in determining how the myths relate to their beliefs and where they find themselves in the stages. Group members had open conversation and dialogue on their thoughts, feelings and experiences with the topic. Davante discussed how he has set boundaries and intends to set additional boundaries with family members  as part of the forgiveness process. Counselor checked in with all group members to share their plans for the afternoon and weekend and encouraged application of coping skills and self-care. Kassim discussed about his practice of video gaming, the benefits, his skill level and ability to connect with his peers/friends. Counselor encouraged other application of coping skills, as well.   Assessment and Plan: Counselor recommends that patient remains in IOP treatment to better manage mental health symptoms and continue to address treatment plan goals. Counselor recommends adherence to crisis/safety plan, taking medications as prescribed and following up with medical professionals if any issues arise.   Follow Up Instructions: Counselor will send Webex link for next session.    I discussed the assessment and treatment plan with the patient. The patient was provided an opportunity to ask questions and all were answered. The patient agreed with the plan and demonstrated an understanding of the instructions.   The patient was advised to call back or seek an in-person evaluation if the symptoms worsen or if the condition fails to improve as anticipated.  I provided 180 minutes of non-face-to-face time during this encounter.   Lise Auer, LCSW

## 2019-09-06 ENCOUNTER — Other Ambulatory Visit (HOSPITAL_COMMUNITY): Payer: BC Managed Care – PPO | Admitting: Psychiatry

## 2019-09-06 ENCOUNTER — Other Ambulatory Visit: Payer: Self-pay

## 2019-09-06 ENCOUNTER — Encounter (HOSPITAL_COMMUNITY): Payer: Self-pay

## 2019-09-06 DIAGNOSIS — F332 Major depressive disorder, recurrent severe without psychotic features: Secondary | ICD-10-CM | POA: Diagnosis not present

## 2019-09-06 NOTE — Progress Notes (Signed)
Virtual Visit via Video Note  I connected with Bruce Warner on 09/06/19 at  9:00 AM EDT by a video enabled telemedicine application and verified that I am speaking with the correct person using two identifiers.  Location: Patient: Bruce Warner Provider: Lise Auer, LCSW   I discussed the limitations of evaluation and management by telemedicine and the availability of in person appointments. The patient expressed understanding and agreed to proceed.  History of Present Illness: MDD   Observations/Objective: Case Manager checked in with all participants to review discharge dates, insurance authorizations, work-related documents and needs for the treatment team. Counselor processed current mood and functioning and discussed how participants spent their time since last session and if skills were applied. [name] shared that he was tired today and having trouble getting fully awake. He experienced sleep difficulties last night with restlessness. He stated that he stayed in his room the majority of the weekend playing video games, other than to grab food a few times. He shared about an interaction between him and a former friend, where he put boundaries in place and communicated his needs. [name] presented with moderate depression and mild anxiety.   Counselor provided a template for creating their individualized safety/crisis plans. Counselor walked through the document step by step, explaining purpose, sharing ideas and concepts to consider, as well as communication with others (supports and professionals) about the plan and application. Group members shared about the components of their plan throughout the session. [name] was able to identify triggers, symptoms, support and professional contacts, how to stay safe and motivators for living. 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  Counselor provided psychoeducation about the creation of S.M.A.R.T goals. Counselor provided the group members with a handout to document their goals related to mental health, health, personal, professional, relational, etc. We plan to discuss the work on goals during [name] session. Counselor checked in with all group members to share their plans for the afternoon and encouraged application of coping skills and self-care. [name] stated that he was still extremely tired and that he planned to take a nap today. Counselor encouraged him to also focus on health and wellbeing, ensuring he ate and socialized with others.   Assessment and Plan: Counselor recommends that patient remains in IOP treatment to better manage mental health symptoms and continue to address treatment plan goals. Counselor recommends adherence to crisis/safety plan, taking medications as prescribed and following up with medical professionals if any issues arise.   Follow Up Instructions: Counselor will send Webex link for next session.    I discussed the assessment and treatment plan with the patient. The patient was provided an opportunity to ask questions and all were answered. The patient agreed with the plan and demonstrated an understanding of the instructions.   The patient was advised to call back or seek an in-person evaluation if the symptoms worsen or if the condition fails to improve as anticipated.  I provided 180 minutes of non-face-to-face time during this encounter.   Lise Auer, LCSW

## 2019-09-07 ENCOUNTER — Encounter (HOSPITAL_COMMUNITY): Payer: Self-pay | Admitting: Family

## 2019-09-07 ENCOUNTER — Other Ambulatory Visit (HOSPITAL_COMMUNITY): Payer: Self-pay | Admitting: Family

## 2019-09-07 ENCOUNTER — Other Ambulatory Visit (HOSPITAL_COMMUNITY): Payer: BC Managed Care – PPO | Admitting: Psychiatry

## 2019-09-07 ENCOUNTER — Other Ambulatory Visit: Payer: Self-pay

## 2019-09-07 DIAGNOSIS — F332 Major depressive disorder, recurrent severe without psychotic features: Secondary | ICD-10-CM

## 2019-09-07 MED ORDER — VORTIOXETINE HBR 20 MG PO TABS: 20.0000 mg | ORAL_TABLET | Freq: Every day | ORAL | 0 refills | Status: DC

## 2019-09-07 MED ORDER — HYDROXYZINE PAMOATE 25 MG PO CAPS: 25.0000 mg | ORAL_CAPSULE | Freq: Every evening | ORAL | 0 refills | Status: DC | PRN

## 2019-09-07 NOTE — Patient Instructions (Signed)
D:  Patient will be discharged today.  A:  Follow up with therapist [name]) weekly and patient to pay deposit in order to follow up with [name].  Encouraged support groups.  R:  Patient receptive.

## 2019-09-07 NOTE — Progress Notes (Signed)
[facility] Virtual Visit via Telephone Note  I connected with Bruce Warner on 09/07/19 at  9:00 AM EDT by telephone and verified that I am speaking with the correct person using two identifiers.   I discussed the limitations, risks, security and privacy concerns of performing an evaluation and management service by telephone and the availability of in person appointments. I also discussed with the patient that there may be a patient responsible charge related to this service. The patient expressed understanding and agreed to proceed.  Follow Up Instructions:    I discussed the assessment and treatment plan with the patient. The patient was provided an opportunity to ask questions and all were answered. The patient agreed with the plan and demonstrated an understanding of the instructions.   The patient was advised to call back or seek an in-person evaluation if the symptoms worsen or if the condition fails to improve as anticipated.  I provided 15 minutes of non-face-to-face time during this encounter.   Derrill Center, NP   [facility] Intensive Outpatient Program Discharge Summary  Bruce Warner [id number]  Admission date: 08/23/2019 Discharge date: 09/07/2019  Reason for admission: Per admission assessment note:  Bruce Warner  is a 23 y.o. Caucasian male presents with worsening  depression and passive suicidal ideations.  He reports feeling suicidal roughly about 2 weeks ago.  Continues to express depression and depressive symptoms.  He reports struggling with depression since the sixth grade.  He reports he has been prescribed Lexapro, Wellbutrin, Pristiq, Lexapro without any symptom relief.  He reports he was recently discharged from his psychiatrist practice due to noncompliance with medication.  And stated" I refused to tell her my plan" avoids to harm him his self.  He denies auditory or visual hallucinations.  Reports restless sleep where he states he has been taking Benadryl  and over-the-counter Unisom to aid with his symptoms.  Discussed initiating Vistaril 25 to 50 mg p.o. nightly patient was receptive to plan.  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  Patient reports he recently contacted [name] office for follow-up care. patient was enrolled in partial psychiatric program on 08/06/19.   Family of Origin Issues: As reported in treatment team patient recently experiencing the passing of his grandmother.  Patient is not available by phone at discharge.  Staff reported contact with patient's aunt whom he resides with.  Progress in Program Toward Treatment Goals: Ongoing, patient attended and participated with daily group session with active and engaged participation.  Patient recently completed partial hospitalization programming and has been in the completion of intensive outpatient programming.  Medication refills will be made available.    Progress (rationale): [name]  and [name], [facility] weekly.  patient to schedule follow-up appointment. M  Take all medications as prescribed. Keep all follow-up appointments as scheduled.  Do not consume alcohol or use illegal drugs while on prescription medications. Report any adverse effects from your medications to your primary care provider promptly.  In the event of recurrent symptoms or worsening symptoms, call 911, a crisis hotline, or go to the nearest emergency department for evaluation.   Derrill Center, NP 09/07/2019
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
## 2019-09-07 NOTE — Progress Notes (Signed)
Virtual Visit via Video Note  I connected with Bruce Warner on 09/07/19 at  9:00 AM EDT by a video enabled telemedicine application and verified that I am speaking with the correct person using two identifiers.  Location: Patient: Bruce Warner Provider: Hilbert Odor, LCSW   I discussed the limitations of evaluation and management by telemedicine and the availability of in person appointments. The patient expressed understanding and agreed to proceed.  History of Present Illness: MDD   Observations/Objective: Case Manager checked in with all participants to review discharge dates, insurance authorizations, work-related documents and needs for the treatment team. Counselor processed current mood and functioning and discussed how participants spent their time since last session and if skills were applied.  [name] shared that he took a nap after group from 12:30-9pm, then took a sleeping pill around 12 midnight and slept until 8:45am. He stated that it was much needed rest and that he was feeling better today. He shared about a happy dream he experienced as well that made him want to work toward his goals even more. [name] presented with mild depression and mild anxiety today.   Counselor reviewed the handout on the creation of S.M.A.R.T goals and prompted group members to share their goals with the group. Some group needed feedback and ideas from the group for clear development and application, so the group discussed openly. [name] discussed his goal and pan for returning to [place] full-time for work and to reconnect with his peers. He has a primary focus on stabilization and maintenance of mental health, applying for jobs and and becoming more social and outgoing. He has a plan for post-group to make himself get up daily, take his laptop and work on resume and portfolio at [employer] as well as meeting up with friends to skateboard. He has established consequences for himself and accountability partners to  prevent giving up on goal.   Counselor shared psychoeducation on mental health diagnoses and the process of assessment, diagnosis and treatment. Group members discussed their experiences with better understanding and treating their mental health. The group discussed barriers to care, accurate diagnosis, stigma and sharing with others. [name] stated that he learned something new about his diagnosis and would like to look more into that aspect. He is still in the process of establishing a psychiatrists and is connected with a therapist.  Counselor spent the remainder of group celebrating the progress from a [name], as today is his last day in treatment. He shared his process and takeaways as well as encouragement for those remaining in group. He shared that he has learned to be patient, have faith in the process, and to know that change is happening whether small or large. Group members all shared well wishes for him as he steps down.    Assessment and Plan: Counselor recommends that patient steps down from IOP treatment to individual therapy and medication management to address mental health symptoms and treatment plan goals. Counselor recommends adherence to crisis/safety plan, taking medications as prescribed and following up with medical professionals if any issues arise.   Follow Up Instructions: Counselor will send Webex link for next session.    I discussed the assessment and treatment plan with the patient. The patient was provided an opportunity to ask questions and all were answered. The patient agreed with the plan and demonstrated an understanding of the instructions.   The patient was advised to call back or seek an in-person evaluation if the symptoms worsen or if the condition  fails to improve as anticipated.  I provided 180 minutes of non-face-to-face time during this encounter.   Lise Auer, LCSW

## 2019-09-08 ENCOUNTER — Other Ambulatory Visit: Payer: Self-pay

## 2019-09-08 ENCOUNTER — Other Ambulatory Visit (HOSPITAL_COMMUNITY): Payer: BC Managed Care – PPO

## 2019-09-09 ENCOUNTER — Ambulatory Visit (HOSPITAL_COMMUNITY): Payer: BC Managed Care – PPO

## 2019-09-10 ENCOUNTER — Ambulatory Visit (HOSPITAL_COMMUNITY): Payer: BC Managed Care – PPO

## 2019-10-05 ENCOUNTER — Ambulatory Visit (INDEPENDENT_AMBULATORY_CARE_PROVIDER_SITE_OTHER): Payer: BC Managed Care – PPO | Admitting: Psychiatry

## 2019-10-05 ENCOUNTER — Encounter: Payer: Self-pay | Admitting: Psychiatry

## 2019-10-05 ENCOUNTER — Other Ambulatory Visit: Payer: Self-pay

## 2019-10-05 VITALS — Ht 71.0 in | Wt 141.0 lb

## 2019-10-05 DIAGNOSIS — F902 Attention-deficit hyperactivity disorder, combined type: Secondary | ICD-10-CM

## 2019-10-05 DIAGNOSIS — F330 Major depressive disorder, recurrent, mild: Secondary | ICD-10-CM

## 2019-10-05 DIAGNOSIS — N522 Drug-induced erectile dysfunction: Secondary | ICD-10-CM | POA: Diagnosis not present

## 2019-10-05 MED ORDER — HYDROXYZINE PAMOATE 50 MG PO CAPS: 50.0000 mg | ORAL_CAPSULE | Freq: Every evening | ORAL | 1 refills | Status: DC | PRN

## 2019-10-05 MED ORDER — LISDEXAMFETAMINE DIMESYLATE 30 MG PO CAPS: 30.0000 mg | ORAL_CAPSULE | Freq: Every day | ORAL | 0 refills | Status: DC

## 2019-10-05 MED ORDER — SILDENAFIL CITRATE 25 MG PO TABS: 25.0000 mg | ORAL_TABLET | Freq: Every day | ORAL | 1 refills | Status: DC | PRN

## 2019-10-05 MED ORDER — VORTIOXETINE HBR 20 MG PO TABS: 20.0000 mg | ORAL_TABLET | Freq: Every day | ORAL | 1 refills | Status: DC

## 2019-10-05 NOTE — Progress Notes (Signed)
[facility] MD/PA/NP Initial Note  10/05/2019 11:58 PM Bruce Warner  MRN:  [id number] PCP:  Damaris Hippo. Weber, PA-C at [facility] Time Spent: 50 minutes from 0810 to 0900  Chief Complaint:  Chief Complaint    Depression; ADHD      HPI: Bruce Warner is seen onsite in office 50 minutes face-to-face individually with consent with epic collateral for psychiatric diagnostic evaluation with medical services following completion of PHP program not helpful at [facility] following IOP which was very helpful for severe recurrent major depression by mid to late October.  Bruce Warner is receiving therapy form [name], [name] since May 2020 after moving back from [place] in the early spring where he was hospitalized in January with significant suicide risk and major depression treated with Lexapro for 3 years.  He saw [name], DNP at [facility] then [name], PA-C for medications before his medications were then from [facility] apparently with Dr. [name] not having an effective therapeutic alliance evident in her dismissal of Bruce Warner from her practice around which time he declined collaborative communication with her regarding assessing suicidal ideation in the course of his programming.  Suicidal ideation was passive and modest this October compared to last January, and he became sincere about getting help inpatient when he needed it.  He now seeks ongoing outpatient psychiatric medication treatment for depression now mild to moderate but not resolved on Trintellix 20 mg every morning, Vyvanse 30 mg every morning sometimes taking only 1/2 capsule if he wakes up late, Vistaril 25 to 50 mg at bedtime for sleep needing 50 mg, and he stopped his own Trileptal 300 mg twice daily and trazodone 50 mg nightly as both were ineffective.  He asks why previous Pristiq 50 mg did not work for him also having Wellbutrin in past and Abilify for 3 months in the past.  Cord reports  depression started at age 23 though [name] had sixth grade as the onset.  He apparently took 3 years of Lexapro and his mid to late teens requiring in 2018 some [name] 25 mg when in [place] for erectile dysfunction on the Lexapro which she states also applies to the Trintellix now he has no further [name] which was effective in free of adverse effects in the past.  He suggested he had some mood instability in the past for which Trileptal was started at this time but he no longer needs that.  He has never had a manic episode or psychotic symptoms.  At age 23 he moved in with sister having lived with mother up to age 23 parents apparently divorcing when he was 23 years of age being the youngest of the children in the family.  Father lives in a Y now with stepmother and mother is local but patient is now having with aunt Judson Roch and her husband in [place] and he gives permission for her to call about any concerns about patient or his treatment.  Patient started Adderall at age 23 for ADHD having significant impulsivity and inattention but only modest hyperactivity.  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  Patient does not offer much of personal reaction to such serious illness.  With sister at age 23 he started cannabis for 3 years using occasional cocaine, mushrooms, and hydrocodone or oxycodone opiates recreationally.  He started cigarettes  and then vaping then both and now just vaping since age 23 years.  Nicotine is calming to him.  He has used alcohol since age 23 and continues that he only uses the nicotine and alcohol now though only vaping not smoking cigarettes now.  He has no anxiety though mother has anxiety and father ADHD with siblings having ADHD and depression.  Patient is repairing for seeking employment in his outpatient therapy.  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  He had 1 year of college at [employer] and failed out has not been back to school.  He plans to seek employment states nothing requires him to do that immediately though he and [name] are working on that in therapy.  He also plays video games excessively till doing so being technically very sophisticated at that.  His driving is adequate with no ticket or accident though not had his car which is still in [place] so that predominately at [name] drives him where he needs to go.  He cannot tabulate the number of depressive episodes but such has been intermittent and he prefers his Vyvanse to be through this office now instead of [facility] with last dispensing per [place] registry being on 09/23/2019.  He has no mania, suicidality, psychosis, or delirium currently.  Visit Diagnosis:    ICD-10-CM   1. Mild recurrent major depression (HCC)  F33.0 vortioxetine HBr (TRINTELLIX) 20 MG TABS tablet    hydrOXYzine (VISTARIL) 50 MG capsule  2. Attention deficit hyperactivity disorder (ADHD), combined type, moderate  F90.2 lisdexamfetamine (VYVANSE) 30 MG capsule    lisdexamfetamine (VYVANSE) 30 MG capsule  3. Drug-induced erectile dysfunction  N52.2 sildenafil ([name]) 25 MG tablet    Past Psychiatric History: ADHD treatment by Dr. Merla Riches age 23 years with Adderall 15 mg IR twice daily PHQ 9 in the 12-15 range ages 3 and 17 years.  His substance use was extensive but still considered recreational in his mid to late adolescence living with sister.  Adderall was eventually changed to Vyvanse. Patient took Lexapro for 3 years also having 3 months of Abilify changed to Wellbutrin, and Pristiq 50 mg was not helpful, now taking Trintellix 20 mg after inpatient in [place] in January 2020 for suicidal depression, then apparently 1-1/2 months and IOP followed by [place] for depression at [facility].  Past Medical History:  Past  Medical History:  Diagnosis Date  . ADHD (attention deficit hyperactivity disorder)   . Aplastic anemia (HCC) 2005   s/p transplant 2005  . Depression   . Drug-induced erectile dysfunction   . Headache   . Seasonal allergies   . SVT (supraventricular tachycardia) (HCC) 2009   s/p ablation 2009    Past Surgical History:  Procedure Laterality Date  . 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 CARDIAC ELECTROPHYSIOLOGY MAPPING AND ABLATION  2009   for svt    Family Psychiatric History: Father has ADHD and substance use disorders abandoning the family living in [place] than [place] remarrying.  Patient lives with mother with anxiety disorder at least until mid adolescence then moving with older sister who had ADHD/OCD, depression, and anxiety disorder.  Brother had ADHD.  Patient is the youngest of the children.  Family History:  Family History  Problem Relation Age of Onset  . Hypertension Maternal Grandfather   . Heart disease Paternal Grandfather   .  Anxiety disorder Mother   . ADD / ADHD Father   . ADD / ADHD Sister   . Depression Sister   . Anxiety disorder Sister   . Alcohol abuse Sister   . ADD / ADHD Brother   . AAA (abdominal aortic aneurysm) Brother   . Hyperlipidemia Maternal Grandmother   . Hypertension Maternal Grandmother   . 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 Marital status: Single    Spouse name: Not on file  . Number of children: 0  . Years of education: 13 years  . Highest education level: Some college, no degree  Occupational History  . Occupation: Primary school teacher    Comment: [employer]  Social Needs  . Financial resource strain: Not hard at all  . Food insecurity    Worry:  Never true    Inability: Never true  . Transportation needs    Medical: Yes    Non-medical: Yes  Tobacco Use  . Smoking status: Former Smoker    Types: Cigarettes  . Smokeless tobacco: Never Used  . Tobacco comment: 1/2 pack per day  Substance and Sexual Activity  . Alcohol use: Yes    Alcohol/week: 2.0 standard drinks    Types: 2 Standard drinks or equivalent per week    Comment: weekly - binge drinking - 10 beers at a time  . Drug use: No    Types: Marijuana, "Crack" cocaine, Psilocybin, Hydrocodone, Oxycodone    Comment: Marijuana - past use   . Sexual activity: Yes  Lifestyle  . Physical activity    Days per week: 0 days    Minutes per session: 0 min  . Stress: Not at all  Relationships  . Social Musician on phone: Three times a week    Gets together: Never    Attends religious service: Never    Active member of club or organization: No    Attends meetings of clubs or organizations: Never    Relationship status: Never married  Other Topics Concern  . Not on file  Social History Narrative   Lived with sister from age 79 years after living with his mother up until age 44 years parents having divorced when he was 7 years as the youngest of his siblings.  He moved to [place] living with father and stepmother then on his own moving back spring 2020 after his January psychiatric hospitalization.  He has lived with aunt [name] and her husband in [place] apparently since moving back from [place].  Employment possibly in Primary school teacher is one of his goals from therapy suggesting he has less monetary than life skill and motivation reason to work, having been released from his job in a [place] in December 2019.  He currently vapes and uses alcohol and no other substances.  He can drive but his automobile is in [place].    Allergies: No Known Allergies  Metabolic Disorder Labs: No results found for: HGBA1C, MPG No results found for: PROLACTIN Lab Results  Component  Value Date   CHOL 114 05/25/2012   TRIG 52 05/25/2012   HDL 56 05/25/2012   CHOLHDL 2.0 05/25/2012   VLDL 10 05/25/2012   LDLCALC 48 05/25/2012   Lab Results  Component Value Date  TSH 1.758 06/26/2015   TSH 2.116 05/26/2013    Therapeutic Level Labs: No results found for: LITHIUM No results found for: VALPROATE No components found for:  CBMZ  Current Medications: Current Outpatient Medications  Medication Sig Dispense Refill  . carbamide peroxide (DEBROX) 6.5 % otic solution Place 5 drops into both ears 2 (two) times daily. 15 mL 1  . hydrOXYzine (VISTARIL) 50 MG capsule Take 1 capsule (50 mg total) by mouth at bedtime as needed (insomnia). 30 capsule 1  . ibuprofen (ADVIL,MOTRIN) 200 MG tablet Take 200 mg by mouth every 6 (six) hours as needed.    Marland Kitchen lisdexamfetamine (VYVANSE) 30 MG capsule Take 1 capsule (30 mg total) by mouth daily. 02/03/17-03/05/17 Fill dates 30 capsule 0  . [START ON 10/23/2019] lisdexamfetamine (VYVANSE) 30 MG capsule Take 1 capsule (30 mg total) by mouth daily after breakfast. 30 capsule 0  . [START ON 11/22/2019] lisdexamfetamine (VYVANSE) 30 MG capsule Take 1 capsule (30 mg total) by mouth daily after breakfast. 30 capsule 0  . sildenafil ([name]) 25 MG tablet Take 1 tablet (25 mg total) by mouth daily as needed for erectile dysfunction. 10 tablet 1  . vortioxetine HBr (TRINTELLIX) 20 MG TABS tablet Take 1 tablet (20 mg total) by mouth daily. 30 tablet 1   No current facility-administered medications for this visit.     Medication Side Effects: sexual dysfunction  Orders placed this visit:  No orders of the defined types were placed in this encounter.   Psychiatric Specialty Exam:  Review of Systems  Constitutional:       130 pounds at neurology appointment in June thereby having a 10 to 15 pound weight gain in the last 6 months weighing 139 pounds 2 years ago when seen for hyperhidrosis in [place].  HENT: Negative.   Eyes: Negative.   Respiratory:        Cannabis then cigarette smoking then vaping  Cardiovascular: Positive for palpitations.       Supraventricular tachycardia requiring ablation therapy in 2008.  Gastrointestinal: Negative.   Genitourinary:       SSRI induced erectile dysfunction  Musculoskeletal: Negative.   Skin: Negative.   Neurological: Positive for headaches. Negative for tremors, sensory change, speech change, focal weakness, seizures and loss of consciousness.       Right foot drop June 2020 per neurology apparently resolving diagnosis per Dr. Hyacinth Meeker.  Endo/Heme/Allergies: Bruises/bleeds easily.       Aplastic anemia treated with autologous bone marrow transplant from brother in 2005 at [place].  Psychiatric/Behavioral: Positive for depression, substance abuse and suicidal ideas. Negative for hallucinations and memory loss. The patient has insomnia. The patient is not nervous/anxious.     Height  (1.803 m), weight 141 lb (64 kg).Body mass index is 19.67 kg/m.  Right-handed with full range of motion cervical spine.  He has no craniofacial dysmorphia and no neurocutaneous stigmata.  There is a tattoo on the left palm.  AMR and DTR are 0/0 with cerebellar functions intact and apparent resolution of right foot drop. Muscle strengths and tone 5/5, postural reflexes and gait 0/0, and AIMS = 0.  PERRLA 4 mm with EOMs intact.  General Appearance: Casual and Fairly Groomed  Eye Contact:  Good  Speech:  Clear and Coherent, Normal Rate and Talkative  Volume:  Normal  Mood:  Depressed, Dysphoric, Euthymic and Some history for learned helplessness with no overt anxiety  Affect:  Congruent, Depressed, Inappropriate and Full Range  Thought Process:  Coherent, Irrelevant,  Linear and Descriptions of Associations: Tangential  Orientation:  Full (Time, Place, and Person)  Thought Content: Rumination and Tangential with cluster C dependent passive-aggressive defenses while sister has OCD  Suicidal Thoughts:  No  Homicidal  Thoughts:  No  Memory:  Immediate;   Good Remote;   Good  Judgement:  Fair  Insight:  Fair  Psychomotor Activity:  Normal, Increased, Mannerisms and Restlessness and moderate impulsivity  Concentration:  Concentration: Fair and Attention Span: Poor  Recall:  Fair  Fund of Knowledge: Good  Language: Good  Assets:  Communication Skills Intimacy Leisure Time Resilience Talents/Skills  ADL's:  Intact  Cognition: WNL  Prognosis:  Good   Screenings:  PHQ2-9     Counselor from 08/18/2019 in BEHAVIORAL HEALTH PARTIAL HOSPITALIZATION PROGRAM Counselor from 08/06/2019 in BEHAVIORAL HEALTH PARTIAL HOSPITALIZATION PROGRAM Office Visit from 11/[facility]/2016 [facility] Visit from 10/[facility]/2016 [facility]  PHQ-2 Total Score  3  6  0  1  0  PHQ-9 Total Score  7  20  -  -  -      Receiving Psychotherapy: Yes  Duanne Limerickicole Mascia, [facility]  Treatment Plan/Recommendations: Negative therapeutic reaction with [facility] after treatment in mid to late adolescence Dr. Ellamae Siaobert Doolittle, then Benny LennertSarah Weber, PA, and then Gwen PoundsLeslie O'Neill Brewington, DNP after inpatient, IOP and PHP for depression must be worked through today to assure that his conflict with the last psychiatrist about discussing any suicide risk is worked through open communication.  He has an effective therapeutic relationship with Pollyann SavoyNicole Masha, LPC from 6 months of therapy.  Foot drop apparently resolved sits throughout the session with his legs crossed at the knees having general health needs to reduce alcohol and vaping tobacco even for the sake of family history of father.  He does not process bone marrow transplant with 4 months inpatient 2005 nor his cardiac ablation 2008 a solution of which may be separate goal for therapy particularly for overcoming learned helplessness and cluster C traits.  Child of substance using father having motherly  object relations with older sister and now aunt Maralyn SagoSarah remain adult object relations developmental tasks of individuation/separation to adult career or employment.  Over 50% of the session 50 minutes face-to-face total of 25 minutes is spent in such counseling and coordination of care as patient will allow at first appointment session.  He is E scribed Vyvanse 30 mg every morning after breakfast as #30 each for December 5 and January 4 for ADHD wishing to transfer that from Benny LennertSarah Weber, [place] and appropriate [facility] from Benny LennertSarah Weber. He is E scribed Trintellix 20 mg every morning sent as #30 [facility] also for depression and ADHD patient also inquiring about dosing range options as though worried he may need a higher dose someday not having definite OCD like sister.  He is E scribed [name] 25 mg as 1 daily as needed for erectile dysfunction #10 with [facility] [address] for side effects of SSRI.  He described hydroxyzine 50 mg at bedtime if needed for insomnia sent as #30 [facility] [address].  He returns in 6 weeks for follow-up    Chauncey MannGlenn E , MD

## 2019-10-11 ENCOUNTER — Other Ambulatory Visit: Payer: Self-pay

## 2019-10-11 DIAGNOSIS — F330 Major depressive disorder, recurrent, mild: Secondary | ICD-10-CM

## 2019-10-11 MED ORDER — VORTIOXETINE HBR 20 MG PO TABS: 20.0000 mg | ORAL_TABLET | Freq: Every day | ORAL | 0 refills | Status: DC

## 2019-10-11 NOTE — Telephone Encounter (Signed)
Patient's insurance request 90 day for Trintellix 20 mg 1/daily due to costs.

## 2019-11-16 ENCOUNTER — Other Ambulatory Visit: Payer: Self-pay

## 2019-11-16 ENCOUNTER — Encounter: Payer: Self-pay | Admitting: Psychiatry

## 2019-11-16 ENCOUNTER — Ambulatory Visit (INDEPENDENT_AMBULATORY_CARE_PROVIDER_SITE_OTHER): Payer: BC Managed Care – PPO | Admitting: Psychiatry

## 2019-11-16 VITALS — Ht 71.0 in | Wt 138.0 lb

## 2019-11-16 DIAGNOSIS — F330 Major depressive disorder, recurrent, mild: Secondary | ICD-10-CM

## 2019-11-16 DIAGNOSIS — N522 Drug-induced erectile dysfunction: Secondary | ICD-10-CM | POA: Diagnosis not present

## 2019-11-16 DIAGNOSIS — F902 Attention-deficit hyperactivity disorder, combined type: Secondary | ICD-10-CM | POA: Diagnosis not present

## 2019-11-16 MED ORDER — VORTIOXETINE HBR 20 MG PO TABS: 20.0000 mg | ORAL_TABLET | Freq: Every day | ORAL | 0 refills | Status: DC

## 2019-11-16 MED ORDER — SILDENAFIL CITRATE 25 MG PO TABS: 25.0000 mg | ORAL_TABLET | Freq: Every day | ORAL | 1 refills | Status: AC | PRN

## 2019-11-16 MED ORDER — HYDROXYZINE PAMOATE 50 MG PO CAPS: 50.0000 mg | ORAL_CAPSULE | Freq: Every evening | ORAL | 0 refills | Status: AC | PRN

## 2019-11-16 NOTE — Progress Notes (Signed)
[facility]  Patient ID: ROSARIO KUSHNER,  MRN: [id number]  PCP: Morrell Riddle, PA-C  Date of Evaluation: 11/16/2019 Time spent:20 minutes from 1500 to 1520  Chief Complaint:  Chief Complaint    ADHD; Depression      HISTORY/CURRENT STATUS: Javius is seen onsite in office face-to-face 20 minutes with consent individually with epic collateral for psychiatric interview and exam in 6-week evaluation and management of depression, ADHD, dog induced erectile dysfunction, and fixation of individuation separation being addressed in therapy with Georgian Co, LPC currently.  In the interim, Toretto has satiated his current relationship for maternallistic needs with aunt [name] with whom he resides to plan return to [place] to reside with father and stepmother in his next step toward building an adult life including relationships, employment, and closure and resolution of his past.  However as he had appointment with Benny Lennert, PA for seborrheic dermatitis where he used to see Dr. Merla Riches for ADHD, patient changed his Vyvanse to Concerta 18 mg titrating from 1-3 daily to determine the best regimen for current needs.  He has continued Trintellix and Vistaril and needs these in closure of his care here for transfer to his psychiatrist in [place].  However he did not fill the [name] as the expense was too significant using the insurance, and he now wishes to pursue GoodRx requesting prescription appropriate for such pursuit in written form.  He plans to return to [place] imminently in February.  He has processed decisions and plans in therapy for preparation as well as with aunt [name] and father.  He reviews all diagnoses in medication matching for treatment to prepare in every way possible for success in [place].  He has no mania, psychosis, delirium, or suicidality.  Depression      The patient presents with depression.  This is a recurrent problem.  The current episode started more than 1 year ago.    The onset quality is sudden.   The problem occurs intermittently.  The problem has been gradually improving since onset.  Associated symptoms include decreased concentration, fatigue, insomnia, decreased interest and sad.  Associated symptoms include no helplessness, no hopelessness, not irritable, no restlessness, no headaches and no suicidal ideas.     The symptoms are aggravated by family issues, social issues and medication.  Past treatments include psychotherapy, SSRIs - Selective serotonin reuptake inhibitors, SNRIs - Serotonin and norepinephrine reuptake inhibitors and other medications.  Compliance with treatment is variable.  Past compliance problems include difficulty with treatment plan and medication issues.  Previous treatment provided moderate relief.  Risk factors include a change in medication usage/dosage, family history, family history of mental illness, illicit drug use, major life event, prior psychiatric admission and stress.   Past medical history includes chronic illness, recent psychiatric admission, depression and mental health disorder.     Pertinent negatives include no life-threatening condition, no physical disability, no bipolar disorder, no obsessive-compulsive disorder, no post-traumatic stress disorder, no schizophrenia, no suicide attempts and no head trauma.   Individual Medical History/ Review of Systems: Changes? :Yes  seborrheic dermatitis of the face and scalp psoriasis treated by Benny Lennert who assumed treatment for his ADHD as at that office in the past changing Vyvanse to Concerta as patient plans return to [place].   Allergies: Patient has no known allergies.  Current Medications:  Current Outpatient Medications:  .  carbamide peroxide (DEBROX) 6.5 % otic solution, Place 5 drops into both ears 2 (two) times daily., Disp: 15 mL,  Rfl: 1 .  hydrOXYzine (VISTARIL) 50 MG capsule, Take 1 capsule (50 mg total) by mouth at bedtime as needed (insomnia)., Disp: 90 capsule,  Rfl: 0 .  ibuprofen (ADVIL,MOTRIN) 200 MG tablet, Take 200 mg by mouth every 6 (six) hours as needed., Disp: , Rfl:  .  lisdexamfetamine (VYVANSE) 30 MG capsule, Take 1 capsule (30 mg total) by mouth daily. 02/03/17-03/05/17 Fill dates, Disp: 30 capsule, Rfl: 0 .  lisdexamfetamine (VYVANSE) 30 MG capsule, Take 1 capsule (30 mg total) by mouth daily after breakfast., Disp: 30 capsule, Rfl: 0 .  [START ON 11/22/2019] lisdexamfetamine (VYVANSE) 30 MG capsule, Take 1 capsule (30 mg total) by mouth daily after breakfast., Disp: 30 capsule, Rfl: 0 .  sildenafil ([name]) 25 MG tablet, Take 1 tablet (25 mg total) by mouth daily as needed for erectile dysfunction., Disp: 30 tablet, Rfl: 1 .  vortioxetine HBr (TRINTELLIX) 20 MG TABS tablet, Take 1 tablet (20 mg total) by mouth daily., Disp: 90 tablet, Rfl: 0  Medication Side Effects: none  Family Medical/ Social History: Changes? Yes patient not examining much his filament in residence with aunt Judson Roch as he meets with father returning to [place] somewhat in identification with father who has ADHD, mother had significant anxiety not necessarily only reasons for their divorce  MENTAL HEALTH EXAM:  Height 5\' 11"  (1.803 m), weight 138 lb (62.6 kg).Body mass index is 19.25 kg/m. Muscle strengths and tone 5/5, postural reflexes and gait 0/0, and AIMS = 0 otherwise deferred for coronavirus shutdown  General Appearance: Casual, Fairly Groomed and Guarded  Eye Contact:  Fair  Speech:  Clear and Coherent, Normal Rate and Talkative  Volume:  Normal  Mood:  Depressed, Dysphoric and Euthymic  Affect:  Congruent, Depressed, Inappropriate and Full Range  Thought Process:  Coherent, Goal Directed, Irrelevant, Linear and Descriptions of Associations: Tangential  Orientation:  Full (Time, Place, and Person)  Thought Content: Ilusions, Rumination and Tangential   Suicidal Thoughts:  No  Homicidal Thoughts:  No  Memory:  Immediate;   Good Remote;   Good  Judgement:  Fair   Insight:  Fair  Psychomotor Activity:  Normal, Decreased, Mannerisms and Psychomotor Retardation  Concentration:  Concentration: Fair and Attention Span: Fair  Recall:  Good  Fund of Knowledge: Good  Language: Good  Assets:  Leisure Time Resilience Talents/Skills  ADL's:  Intact  Cognition: WNL  Prognosis:  Fair    DIAGNOSES:    ICD-10-CM   1. Mild recurrent major depression (HCC)  F33.0 hydrOXYzine (VISTARIL) 50 MG capsule    vortioxetine HBr (TRINTELLIX) 20 MG TABS tablet  2. Attention deficit hyperactivity disorder (ADHD), combined type, moderate  F90.2   3. 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  Over 50% of the 20-minute face-to-face time is spent in 10 minutes total of counseling and coordination of care integrating the response to Concerta likely to be fianl dose of 54 mg every morning from [name], PA as Alycia Patten has been discontinued to adequate supply of current medications otherwise for  transition to his psychiatrist in [place].  Important therapy is his realization of transfer of dependence from Chauncy Lean to father hopefully in a way that can be fulfilling for his adult maturation and success.  He is E  scribed Trintellix 20 mg every bedtime sent as #90 with no refill to [facility] [address] for depression and ADHD.  He is E scribed Vistaril 50 mg every bedtime sent as #90 with no refill to [facility] [address] for insomnia associated with depression and ADHD.  He is provided a printed prescription for [name] 25 mg daily as needed for erectile dysfunction associated with his depressants as he intends to find suitable economics in the pharmacy system in order to financially  prepare for that transition to [place] as well. He may return as needed in the interim subsequently but expects closure see his physician in [place] currently Dr. Clarice PoleJonathan Shun.  Chauncey MannGlenn E Tyshae Stair, MD

## 2020-01-18 ENCOUNTER — Other Ambulatory Visit: Payer: Self-pay | Admitting: Psychiatry

## 2020-01-18 DIAGNOSIS — F330 Major depressive disorder, recurrent, mild: Secondary | ICD-10-CM

## 2020-01-19 ENCOUNTER — Other Ambulatory Visit: Payer: Self-pay | Admitting: Psychiatry

## 2020-01-19 DIAGNOSIS — F330 Major depressive disorder, recurrent, mild: Secondary | ICD-10-CM

## 2020-01-19 NOTE — Telephone Encounter (Signed)
Not sure if current patient

## 2020-01-19 NOTE — Telephone Encounter (Signed)
Patient has apparently not moved to [place] yet but is possibly gathering medication for that move requesting a 90-day supply fill of Trintellix 20 mg every morning sent with no refill to [facility] [address] rather than previous [place].

## 2020-08-06 ENCOUNTER — Other Ambulatory Visit: Payer: Self-pay | Admitting: Family

## 2020-08-06 DIAGNOSIS — F332 Major depressive disorder, recurrent severe without psychotic features: Secondary | ICD-10-CM

## 2020-09-06 ENCOUNTER — Encounter: Payer: Self-pay | Admitting: Psychiatry
